# Patient Record
Sex: Female | Born: 1965
Health system: Southern US, Community
[De-identification: ages and names within clinical notes are randomized; demographics above are authoritative.]

## PROBLEM LIST (undated history)

## (undated) DIAGNOSIS — R519 Headache, unspecified: Secondary | ICD-10-CM

## (undated) DIAGNOSIS — F419 Anxiety disorder, unspecified: Secondary | ICD-10-CM

## (undated) DIAGNOSIS — R609 Edema, unspecified: Secondary | ICD-10-CM

## (undated) DIAGNOSIS — N809 Endometriosis, unspecified: Secondary | ICD-10-CM

## (undated) DIAGNOSIS — R102 Pelvic and perineal pain: Secondary | ICD-10-CM

## (undated) DIAGNOSIS — R51 Headache: Secondary | ICD-10-CM

## (undated) DIAGNOSIS — J309 Allergic rhinitis, unspecified: Secondary | ICD-10-CM

## (undated) DIAGNOSIS — K219 Gastro-esophageal reflux disease without esophagitis: Secondary | ICD-10-CM

## (undated) HISTORY — DX: Gastro-esophageal reflux disease without esophagitis: K21.9

## (undated) HISTORY — PX: OOPHORECTOMY: SHX86

## (undated) HISTORY — PX: OTHER SURGICAL HISTORY: SHX169

## (undated) HISTORY — DX: Endometriosis, unspecified: N80.9

## (undated) HISTORY — DX: Edema, unspecified: R60.9

## (undated) HISTORY — DX: Pelvic and perineal pain: R10.2

## (undated) HISTORY — PX: ABDOMINAL HYSTERECTOMY: SHX81

## (undated) HISTORY — DX: Allergic rhinitis, unspecified: J30.9

---

## 2013-05-09 ENCOUNTER — Ambulatory Visit: Payer: Self-pay | Admitting: Family Medicine

## 2013-06-13 LAB — HM PAP SMEAR

## 2013-06-24 LAB — HM MAMMOGRAPHY: HM MAMMO: NORMAL (ref 0–4)

## 2015-06-04 ENCOUNTER — Encounter: Payer: Self-pay | Admitting: Family Medicine

## 2015-06-04 ENCOUNTER — Ambulatory Visit (INDEPENDENT_AMBULATORY_CARE_PROVIDER_SITE_OTHER): Payer: BLUE CROSS/BLUE SHIELD | Admitting: Family Medicine

## 2015-06-04 VITALS — BP 106/70 | HR 71 | Temp 98.5°F | Wt 189.0 lb

## 2015-06-04 DIAGNOSIS — J309 Allergic rhinitis, unspecified: Secondary | ICD-10-CM | POA: Insufficient documentation

## 2015-06-04 DIAGNOSIS — J029 Acute pharyngitis, unspecified: Secondary | ICD-10-CM | POA: Diagnosis not present

## 2015-06-04 DIAGNOSIS — K219 Gastro-esophageal reflux disease without esophagitis: Secondary | ICD-10-CM | POA: Insufficient documentation

## 2015-06-04 NOTE — Progress Notes (Signed)
BP 106/70 mmHg  Pulse 71  Temp(Src) 98.5 F (36.9 C)  Wt 189 lb (85.73 kg)  SpO2 99%   Subjective:    Patient ID: Kathy Reeves, female    DOB: 1966-02-07, 50 y.o.   MRN: [id number]  HPI: Kathy Reeves is a 50 y.o. female  Chief Complaint  Patient presents with  . URI    sore throat, ear ache, and right sided neck pain. Patient has a tonsil stone on the right side   UPPER RESPIRATORY TRACT INFECTION Duration: 2 days Worst symptom: sore throat, has a tonsil stone Fever: yes Cough: yes Shortness of breath: no Wheezing: no Chest pain: yes, with cough Chest tightness: no Chest congestion: no Nasal congestion: yes Runny nose: yes Post nasal drip: yes Sneezing: yes Sore throat: yes Swollen glands: yes Sinus pressure: yes Headache: yes Face pain: no Toothache: no Ear pain: yes bilateral Ear pressure: yes bilateral Eyes red/itching:yes Eye drainage/crusting: no  Vomiting: no Rash: no Fatigue: yes Sick contacts: yes Strep contacts: no  Context: worse Recurrent sinusitis: no Relief with OTC cold/cough medications: no  Treatments attempted: essential oils and anti-histamine   Relevant past medical, surgical, family and social history reviewed and updated as indicated. Interim medical history since our last visit reviewed. Allergies and medications reviewed and updated.  Review of Systems  Constitutional: Positive for fever and fatigue. Negative for chills, diaphoresis, activity change, appetite change and unexpected weight change.  HENT: Positive for postnasal drip and sore throat. Negative for congestion, dental problem, drooling, ear discharge, ear pain, facial swelling, hearing loss, mouth sores, nosebleeds, rhinorrhea, sinus pressure, sneezing, tinnitus, trouble swallowing and voice change.   Respiratory: Negative.   Cardiovascular: Negative.   Psychiatric/Behavioral: Negative.     Per HPI unless specifically indicated above     Objective:    BP 106/70  mmHg  Pulse 71  Temp(Src) 98.5 F (36.9 C)  Wt 189 lb (85.73 kg)  SpO2 99%  Wt Readings from Last 3 Encounters:  06/04/15 189 lb (85.73 kg)  06/13/13 173 lb (78.472 kg)    Physical Exam  Constitutional: She is oriented to person, place, and time. She appears well-developed and well-nourished. No distress.  HENT:  Head: Normocephalic and atraumatic.  Right Ear: Hearing, tympanic membrane, external ear and ear canal normal.  Left Ear: Hearing, tympanic membrane, external ear and ear canal normal.  Nose: Nose normal.  Mouth/Throat: Uvula is midline and mucous membranes are normal. Oropharyngeal exudate present.    Eyes: Conjunctivae, EOM and lids are normal. Pupils are equal, round, and reactive to light. Right eye exhibits no discharge. Left eye exhibits no discharge. No scleral icterus.  Neck: Normal range of motion. Neck supple. No JVD present. No tracheal deviation present. No thyromegaly present.  Cardiovascular: Normal rate, regular rhythm, normal heart sounds and intact distal pulses.  Exam reveals no gallop and no friction rub.   No murmur heard. Pulmonary/Chest: Effort normal and breath sounds normal. No stridor. No respiratory distress. She has no wheezes. She has no rales. She exhibits no tenderness.  Musculoskeletal: Normal range of motion.  Lymphadenopathy:    She has cervical adenopathy.  Neurological: She is alert and oriented to person, place, and time.  Skin: Skin is warm, dry and intact. No rash noted. She is not diaphoretic. No erythema.  Psychiatric: She has a normal mood and affect. Her speech is normal and behavior is normal. Judgment and thought content normal. Cognition and memory are normal.  Nursing note and  vitals reviewed.   Results for orders placed or performed in visit on 06/04/15  HM MAMMOGRAPHY  Result Value Ref Range   HM Mammogram Self Reported Normal 0-4 Bi-Rad, Self Reported Normal  HM PAP SMEAR  Result Value Ref Range   HM Pap smear per PP         Assessment & Plan:   Problem List Items Addressed This Visit    None    Visit Diagnoses    Sore throat    -  Primary    Strep negative. Likely URI. Rest. Gargles with warm salt water. Call if not getting better or getting worse. Call with any concerns.     Relevant Orders    Rapid strep screen (not at [facility])        Follow up plan: Return if symptoms worsen or fail to improve.

## 2015-06-06 LAB — CULTURE, GROUP A STREP: STREP A CULTURE: NEGATIVE

## 2015-06-06 LAB — RAPID STREP SCREEN (MED CTR MEBANE ONLY): STREP GP A AG, IA W/REFLEX: NEGATIVE

## 2015-06-19 ENCOUNTER — Ambulatory Visit (INDEPENDENT_AMBULATORY_CARE_PROVIDER_SITE_OTHER): Payer: BLUE CROSS/BLUE SHIELD | Admitting: Unknown Physician Specialty

## 2015-06-19 ENCOUNTER — Encounter: Payer: Self-pay | Admitting: Unknown Physician Specialty

## 2015-06-19 ENCOUNTER — Telehealth: Payer: Self-pay

## 2015-06-19 VITALS — BP 121/77 | HR 67 | Temp 98.4°F | Ht 63.7 in | Wt 192.2 lb

## 2015-06-19 DIAGNOSIS — N898 Other specified noninflammatory disorders of vagina: Secondary | ICD-10-CM | POA: Diagnosis not present

## 2015-06-19 LAB — WET PREP FOR TRICH, YEAST, CLUE
CLUE CELL EXAM: NEGATIVE
Trichomonas Exam: NEGATIVE
YEAST EXAM: NEGATIVE

## 2015-06-19 NOTE — Telephone Encounter (Signed)
Patient notified. She thinks she may be able to get transportation this afternoon. She scheduled an appt to see Elnita MaxwellCheryl this afternoon. She will call back if she is unable to get a ride.

## 2015-06-19 NOTE — Progress Notes (Signed)
   BP 121/77 mmHg  Pulse 67  Temp(Src) 98.4 F (36.9 C)  Ht 5' 3.7" (1.618 m)  Wt 192 lb 3.2 oz (87.181 kg)  BMI 33.30 kg/m2  SpO2 99%  LMP 05/29/2015 (Exact Date)   Subjective:    Patient ID: Kathy Reeves, female    DOB: 1965/12/31, 50 y.o.   MRN: [id number]  HPI: Kathy Reeves is a 50 y.o. female  Chief Complaint  Patient presents with  . Cyst    pt states she thinks she has a cyst in vaginal area. States she first noticed/felt it this morning.    States she noticed it this morning.  Also noticing more discharge.  Endometrial ablation 1 1/2 years ago.  Having some bleeding.    Relevant past medical, surgical, family and social history reviewed and updated as indicated. Interim medical history since our last visit reviewed. Allergies and medications reviewed and updated.  Review of Systems  Per HPI unless specifically indicated above     Objective:    BP 121/77 mmHg  Pulse 67  Temp(Src) 98.4 F (36.9 C)  Ht 5' 3.7" (1.618 m)  Wt 192 lb 3.2 oz (87.181 kg)  BMI 33.30 kg/m2  SpO2 99%  LMP 05/29/2015 (Exact Date)  Wt Readings from Last 3 Encounters:  06/19/15 192 lb 3.2 oz (87.181 kg)  06/04/15 189 lb (85.73 kg)  06/13/13 173 lb (78.472 kg)    Physical Exam  Constitutional: She is oriented to person, place, and time. She appears well-developed and well-nourished. No distress.  HENT:  Head: Normocephalic and atraumatic.  Eyes: Conjunctivae and lids are normal. Right eye exhibits no discharge. Left eye exhibits no discharge. No scleral icterus.  Cardiovascular: Normal rate.   Pulmonary/Chest: Effort normal.  Abdominal: Normal appearance. There is no splenomegaly or hepatomegaly.  Genitourinary: There is no rash, tenderness, lesion or injury on the right labia. There is lesion on the left labia. There is no rash, tenderness or injury on the left labia. Vaginal discharge found.  Very small firm nodule left labia  Musculoskeletal: Normal range of motion.   Neurological: She is alert and oriented to person, place, and time.  Skin: Skin is intact. No rash noted. No pallor.  Psychiatric: She has a normal mood and affect. Her behavior is normal. Judgment and thought content normal.   Wet prep is negative    Assessment & Plan:   Problem List Items Addressed This Visit    None    Visit Diagnoses    Vaginal discharge    -  Primary    Relevant Orders    WET PREP FOR TRICH, YEAST, CLUE    Vaginal lesion        Tis seems to be improved.  It is a 1/2 pea size now.  Will watch for now.  Bartholian glands are normal        Follow up plan: Return if symptoms worsen or fail to improve.

## 2015-06-19 NOTE — Telephone Encounter (Signed)
She had some irritation and soreness in her vaginal area. This morning it was more tender and she felt a lump at her vaginal area. She thinks it might have been a Bartholin gland cyst. She applied a warm compress to the area and she thinks it has busted. She said there is some burning, but feels better when sitting. She wants advice on it she should be seen? Or if there is anything she needs to do or creams to use? She doesn't have a car today so she can't come in.

## 2015-06-19 NOTE — Telephone Encounter (Signed)
She should be seen. Do sitz baths until she can come in.

## 2015-06-23 LAB — LIPID PANEL
CHOLESTEROL: 187 mg/dL (ref 0–200)
HDL: 66 mg/dL (ref 35–70)
LDL Cholesterol: 102 mg/dL
Triglycerides: 89 mg/dL (ref 40–160)

## 2015-07-02 ENCOUNTER — Encounter: Payer: Self-pay | Admitting: Family Medicine

## 2015-07-02 ENCOUNTER — Ambulatory Visit (INDEPENDENT_AMBULATORY_CARE_PROVIDER_SITE_OTHER): Payer: BLUE CROSS/BLUE SHIELD | Admitting: Family Medicine

## 2015-07-02 VITALS — BP 112/74 | HR 76 | Temp 98.6°F | Ht 63.7 in | Wt 190.0 lb

## 2015-07-02 DIAGNOSIS — Z1239 Encounter for other screening for malignant neoplasm of breast: Secondary | ICD-10-CM

## 2015-07-02 DIAGNOSIS — Z0000 Encounter for general adult medical examination without abnormal findings: Secondary | ICD-10-CM

## 2015-07-02 DIAGNOSIS — Z1211 Encounter for screening for malignant neoplasm of colon: Secondary | ICD-10-CM

## 2015-07-02 LAB — UA/M W/RFLX CULTURE, ROUTINE
Bilirubin, UA: NEGATIVE
GLUCOSE, UA: NEGATIVE
KETONES UA: NEGATIVE
LEUKOCYTES UA: NEGATIVE
Nitrite, UA: NEGATIVE
PROTEIN UA: NEGATIVE
RBC, UA: NEGATIVE
Specific Gravity, UA: 1.015 (ref 1.005–1.030)
UUROB: 0.2 mg/dL (ref 0.2–1.0)
pH, UA: 7.5 (ref 5.0–7.5)

## 2015-07-02 NOTE — Patient Instructions (Signed)
Health Maintenance, Female Adopting a healthy lifestyle and getting preventive care can go a long way to promote health and wellness. Talk with your health care provider about what schedule of regular examinations is right for you. This is a good chance for you to check in with your provider about disease prevention and staying healthy. In between checkups, there are plenty of things you can do on your own. Experts have done a lot of research about which lifestyle changes and preventive measures are most likely to keep you healthy. Ask your health care provider for more information. WEIGHT AND DIET  Eat a healthy diet  Be sure to include plenty of vegetables, fruits, low-fat dairy products, and lean protein.  Do not eat a lot of foods high in solid fats, added sugars, or salt.  Get regular exercise. This is one of the most important things you can do for your health.  Most adults should exercise for at least 150 minutes each week. The exercise should increase your heart rate and make you sweat (moderate-intensity exercise).  Most adults should also do strengthening exercises at least twice a week. This is in addition to the moderate-intensity exercise.  Maintain a healthy weight  Body mass index (BMI) is a measurement that can be used to identify possible weight problems. It estimates body fat based on height and weight. Your health care provider can help determine your BMI and help you achieve or maintain a healthy weight.  For females 33 years of age and older:   A BMI below 18.5 is considered underweight.  A BMI of 18.5 to 24.9 is normal.  A BMI of 25 to 29.9 is considered overweight.  A BMI of 30 and above is considered obese.  Watch levels of cholesterol and blood lipids  You should start having your blood tested for lipids and cholesterol at 51 years of age, then have this test every 5 years.  You may need to have your cholesterol levels checked more often if:  Your lipid  or cholesterol levels are high.  You are older than 51 years of age.  You are at high risk for heart disease.  CANCER SCREENING   Lung Cancer  Lung cancer screening is recommended for adults 51-80 years old who are at high risk for lung cancer because of a history of smoking.  A yearly low-dose CT scan of the lungs is recommended for people who:  Currently smoke.  Have quit within the past 15 years.  Have at least a 30-pack-year history of smoking. A pack year is smoking an average of one pack of cigarettes a day for 1 year.  Yearly screening should continue until it has been 15 years since you quit.  Yearly screening should stop if you develop a health problem that would prevent you from having lung cancer treatment.  Breast Cancer  Practice breast self-awareness. This means understanding how your breasts normally appear and feel.  It also means doing regular breast self-exams. Let your health care provider know about any changes, no matter how small.  If you are in your 51 or 30s, you should have a clinical breast exam (CBE) by a health care provider every 1-3 years as part of a regular health exam.  If you are 51 or older, have a CBE every year. Also consider having a breast X-ray (mammogram) every year.  If you have a family history of breast cancer, talk to your health care provider about genetic screening.  If you  are at high risk for breast cancer, talk to your health care provider about having an MRI and a mammogram every year.  Breast cancer gene (BRCA) assessment is recommended for women who have family members with BRCA-related cancers. BRCA-related cancers include:  Breast.  Ovarian.  Tubal.  Peritoneal cancers.  Results of the assessment will determine the need for genetic counseling and BRCA1 and BRCA2 testing. Cervical Cancer Your health care provider may recommend that you be screened regularly for cancer of the pelvic organs (ovaries, uterus, and  vagina). This screening involves a pelvic examination, including checking for microscopic changes to the surface of your cervix (Pap test). You may be encouraged to have this screening done every 3 years, beginning at age 51.  For women ages 51-65, health care providers may recommend pelvic exams and Pap testing every 3 years, or they may recommend the Pap and pelvic exam, combined with testing for human papilloma virus (HPV), every 5 years. Some types of HPV increase your risk of cervical cancer. Testing for HPV may also be done on women of any age with unclear Pap test results.  Other health care providers may not recommend any screening for nonpregnant women who are considered low risk for pelvic cancer and who do not have symptoms. Ask your health care provider if a screening pelvic exam is right for you.  If you have had past treatment for cervical cancer or a condition that could lead to cancer, you need Pap tests and screening for cancer for at least 20 years after your treatment. If Pap tests have been discontinued, your risk factors (such as having a new sexual partner) need to be reassessed to determine if screening should resume. Some women have medical problems that increase the chance of getting cervical cancer. In these cases, your health care provider may recommend more frequent screening and Pap tests. Colorectal Cancer  This type of cancer can be detected and often prevented.  Routine colorectal cancer screening usually begins at 50 years of age and continues through 51 years of age.  Your health care provider may recommend screening at an earlier age if you have risk factors for colon cancer.  Your health care provider may also recommend using home test kits to check for hidden blood in the stool.  A small camera at the end of a tube can be used to examine your colon directly (sigmoidoscopy or colonoscopy). This is done to check for the earliest forms of colorectal  cancer.  Routine screening usually begins at age 50.  Direct examination of the colon should be repeated every 5-10 years through 51 years of age. However, you may need to be screened more often if early forms of precancerous polyps or small growths are found. Skin Cancer  Check your skin from head to toe regularly.  Tell your health care provider about any new moles or changes in moles, especially if there is a change in a mole's shape or color.  Also tell your health care provider if you have a mole that is larger than the size of a pencil eraser.  Always use sunscreen. Apply sunscreen liberally and repeatedly throughout the day.  Protect yourself by wearing long sleeves, pants, a wide-brimmed hat, and sunglasses whenever you are outside. HEART DISEASE, DIABETES, AND HIGH BLOOD PRESSURE   High blood pressure causes heart disease and increases the risk of stroke. High blood pressure is more likely to develop in:  People who have blood pressure in the high end   of the normal range (130-139/85-89 mm Hg).  People who are overweight or obese.  People who are African American.  If you are 38-23 years of age, have your blood pressure checked every 3-5 years. If you are 61 years of age or older, have your blood pressure checked every year. You should have your blood pressure measured twice--once when you are at a hospital or clinic, and once when you are not at a hospital or clinic. Record the average of the two measurements. To check your blood pressure when you are not at a hospital or clinic, you can use:  An automated blood pressure machine at a pharmacy.  A home blood pressure monitor.  If you are between 45 years and 39 years old, ask your health care provider if you should take aspirin to prevent strokes.  Have regular diabetes screenings. This involves taking a blood sample to check your fasting blood sugar level.  If you are at a normal weight and have a low risk for diabetes,  have this test once every three years after 51 years of age.  If you are overweight and have a high risk for diabetes, consider being tested at a younger age or more often. PREVENTING INFECTION  Hepatitis B  If you have a higher risk for hepatitis B, you should be screened for this virus. You are considered at high risk for hepatitis B if:  You were born in a country where hepatitis B is common. Ask your health care provider which countries are considered high risk.  Your parents were born in a high-risk country, and you have not been immunized against hepatitis B (hepatitis B vaccine).  You have HIV or AIDS.  You use needles to inject street drugs.  You live with someone who has hepatitis B.  You have had sex with someone who has hepatitis B.  You get hemodialysis treatment.  You take certain medicines for conditions, including cancer, organ transplantation, and autoimmune conditions. Hepatitis C  Blood testing is recommended for:  Everyone born from [birth year] through 1966.  Anyone with known risk factors for hepatitis C. Sexually transmitted infections (STIs)  You should be screened for sexually transmitted infections (STIs) including gonorrhea and chlamydia if:  You are sexually active and are younger than 51 years of age.  You are older than 51 years of age and your health care provider tells you that you are at risk for this type of infection.  Your sexual activity has changed since you were last screened and you are at an increased risk for chlamydia or gonorrhea. Ask your health care provider if you are at risk.  If you do not have HIV, but are at risk, it may be recommended that you take a prescription medicine daily to prevent HIV infection. This is called pre-exposure prophylaxis (PrEP). You are considered at risk if:  You are sexually active and do not regularly use condoms or know the HIV status of your partner(s).  You take drugs by injection.  You are sexually  active with a partner who has HIV. Talk with your health care provider about whether you are at high risk of being infected with HIV. If you choose to begin PrEP, you should first be tested for HIV. You should then be tested every 3 months for as long as you are taking PrEP.  PREGNANCY   If you are premenopausal and you may become pregnant, ask your health care provider about preconception counseling.  If you may  become pregnant, take 400 to 800 micrograms (mcg) of folic acid every day.  If you want to prevent pregnancy, talk to your health care provider about birth control (contraception). OSTEOPOROSIS AND MENOPAUSE   Osteoporosis is a disease in which the bones lose minerals and strength with aging. This can result in serious bone fractures. Your risk for osteoporosis can be identified using a bone density scan.  If you are 61 years of age or older, or if you are at risk for osteoporosis and fractures, ask your health care provider if you should be screened.  Ask your health care provider whether you should take a calcium or vitamin D supplement to lower your risk for osteoporosis.  Menopause may have certain physical symptoms and risks.  Hormone replacement therapy may reduce some of these symptoms and risks. Talk to your health care provider about whether hormone replacement therapy is right for you.  HOME CARE INSTRUCTIONS   Schedule regular health, dental, and eye exams.  Stay current with your immunizations.   Do not use any tobacco products including cigarettes, chewing tobacco, or electronic cigarettes.  If you are pregnant, do not drink alcohol.  If you are breastfeeding, limit how much and how often you drink alcohol.  Limit alcohol intake to no more than 1 drink per day for nonpregnant women. One drink equals 12 ounces of beer, 5 ounces of wine, or 1 ounces of hard liquor.  Do not use street drugs.  Do not share needles.  Ask your health care provider for help if  you need support or information about quitting drugs.  Tell your health care provider if you often feel depressed.  Tell your health care provider if you have ever been abused or do not feel safe at home.   This information is not intended to replace advice given to you by your health care provider. Make sure you discuss any questions you have with your health care provider.   Document Released: 09/02/2010 Document Revised: 03/10/2014 Document Reviewed: 01/19/2013 Elsevier Interactive Patient Education [employer].

## 2015-07-02 NOTE — Progress Notes (Signed)
BP 112/74 mmHg  Pulse 76  Temp(Src) 98.6 F (37 C)  Ht 5' 3.7" (1.618 m)  Wt 190 lb (86.183 kg)  BMI 32.92 kg/m2  SpO2 99%  LMP 06/19/2015 (Exact Date)   Subjective:    Patient ID: Kathy Reeves, female    DOB: 1966/02/18, 50 y.o.   MRN: [id number]  HPI: Kathy Reeves is a 50 y.o. female presenting on 07/02/2015 for comprehensive medical examination. Current medical complaints include: none  She currently lives with: husband and kids Menopausal Symptoms: no, irregular periods though  Depression Screen done today and results listed below:  Depression screen PHQ 2/9 07/02/2015  Decreased Interest 0  Down, Depressed, Hopeless 1  PHQ - 2 Score 1    Past Medical History:  Past Medical History  Diagnosis Date  . Allergic rhinitis   . GERD (gastroesophageal reflux disease)     Surgical History:  Past Surgical History  Procedure Laterality Date  . Cesarean section    . Uterus ablation      Medications:  Current Outpatient Prescriptions on File Prior to Visit  Medication Sig  . cetirizine (ZYRTEC) 10 MG chewable tablet Chew 10 mg by mouth daily as needed.   . Cyanocobalamin (VITAMIN B-12 CR) 1000 MCG TBCR Take by mouth as needed.   . Lactobacillus (ACIDOPHILUS PO) Take by mouth as needed.   . Magnesium 250 MG TABS Take by mouth as needed.   . Omega-3 Fatty Acids (FISH OIL) 1200 MG CAPS Take 1,200 mg by mouth as needed.    No current facility-administered medications on file prior to visit.    Allergies:  Allergies  Allergen Reactions  . Latex Rash    Social History:  Social History   Social History  . Marital Status: Married    Spouse Name: N/A  . Number of Children: N/A  . Years of Education: N/A   Occupational History  . Not on file.   Social History Main Topics  . Smoking status: Never Smoker   . Smokeless tobacco: Never Used  . Alcohol Use: No  . Drug Use: No  . Sexual Activity: Yes    Birth Control/ Protection: Surgical   Other Topics Concern    . Not on file   Social History Narrative   History  Smoking status  . Never Smoker   Smokeless tobacco  . Never Used   History  Alcohol Use No    Family History:  Family History  Problem Relation Age of Onset  . CAD Mother   . Heart disease Mother   . Fibromyalgia Mother   . Arthritis Mother   . Cystic kidney disease Mother   . Diabetes Father     Past medical history, surgical history, medications, allergies, family history and social history reviewed with patient today and changes made to appropriate areas of the chart.   Review of Systems  HENT: Negative.   Eyes: Negative.   Respiratory: Negative.   Cardiovascular: Positive for palpitations. Negative for chest pain, orthopnea, claudication, leg swelling and PND.  Gastrointestinal: Positive for abdominal pain (LLQ pain, chronic, no changes). Negative for heartburn, nausea, vomiting, diarrhea, constipation, blood in stool and melena.  Genitourinary: Negative.   Musculoskeletal: Positive for joint pain (due to previous hip injury). Negative for myalgias, back pain, falls and neck pain.  Skin: Negative.   Neurological: Negative.   Endo/Heme/Allergies: Positive for environmental allergies. Negative for polydipsia. Does not bruise/bleed easily.  Psychiatric/Behavioral: Negative.     All other ROS  negative except what is listed above and in the HPI.      Objective:    BP 112/74 mmHg  Pulse 76  Temp(Src) 98.6 F (37 C)  Ht 5' 3.7" (1.618 m)  Wt 190 lb (86.183 kg)  BMI 32.92 kg/m2  SpO2 99%  LMP 06/19/2015 (Exact Date)  Wt Readings from Last 3 Encounters:  07/02/15 190 lb (86.183 kg)  06/19/15 192 lb 3.2 oz (87.181 kg)  06/04/15 189 lb (85.73 kg)    Physical Exam  Constitutional: She is oriented to person, place, and time. She appears well-developed and well-nourished. No distress.  HENT:  Head: Normocephalic and atraumatic.  Right Ear: Hearing and external ear normal.  Left Ear: Hearing and external ear  normal.  Nose: Nose normal.  Mouth/Throat: Oropharynx is clear and moist. No oropharyngeal exudate.  Eyes: Conjunctivae, EOM and lids are normal. Pupils are equal, round, and reactive to light. Right eye exhibits no discharge. Left eye exhibits no discharge. No scleral icterus.  Neck: Normal range of motion. Neck supple. No JVD present. No tracheal deviation present. No thyromegaly present.  Cardiovascular: Normal rate, regular rhythm, normal heart sounds and intact distal pulses.  Exam reveals no gallop and no friction rub.   No murmur heard. Pulmonary/Chest: Effort normal and breath sounds normal. No stridor. No respiratory distress. She has no wheezes. She has no rales. She exhibits no tenderness. Right breast exhibits no inverted nipple, no mass, no nipple discharge, no skin change and no tenderness. Left breast exhibits no inverted nipple, no mass, no nipple discharge, no skin change and no tenderness. Breasts are symmetrical.    Abdominal: Soft. Bowel sounds are normal. She exhibits no distension and no mass. There is no tenderness. There is no rebound and no guarding.  Genitourinary:  Deferred with shared decision making   Musculoskeletal: Normal range of motion. She exhibits no edema or tenderness.  Lymphadenopathy:    She has no cervical adenopathy.  Neurological: She is alert and oriented to person, place, and time. She has normal reflexes. She displays normal reflexes. No cranial nerve deficit. She exhibits normal muscle tone. Coordination normal.  Skin: Skin is warm, dry and intact. No rash noted. She is not diaphoretic. No erythema. No pallor.  >100 moles, none looking concerning  Psychiatric: She has a normal mood and affect. Her speech is normal and behavior is normal. Judgment and thought content normal. Cognition and memory are normal.  Nursing note and vitals reviewed.   Results for orders placed or performed in visit on 07/02/15  Lipid panel  Result Value Ref Range    Triglycerides 89 40 - 160 mg/dL   Cholesterol 161 0 - 096 mg/dL   HDL 66 35 - 70 mg/dL   LDL Cholesterol 045 mg/dL      Assessment & Plan:   Problem List Items Addressed This Visit    None    Visit Diagnoses    Routine general medical examination at a health care facility    -  Primary    Screening labs ordered today. Diet and exercise. Up to date on vaccines. Pap up to date. Mammogram ordered. Will look into colon cancer screening.    Relevant Orders    CBC with Differential/Platelet    Comprehensive metabolic panel    TSH    UA/M w/rflx Culture, Routine    MM SCREENING BREAST TOMO BILATERAL    Screening for colon cancer        Will check on the cost  of cologuard. FOBT given to patient today     Relevant Orders    POC Hemoccult Bld/Stl (3-Cd Home Screen)    Screening for breast cancer        Order for mammogram put in today. Await results.     Relevant Orders    MM SCREENING BREAST TOMO BILATERAL        Follow up plan: Return in about 1 year (around 07/01/2016) for Physcial with Pap.   LABORATORY TESTING:  - Pap smear: up to date  IMMUNIZATIONS:   - Tdap: Tetanus vaccination status reviewed: last tetanus booster within 10 years. - Influenza: Postponed to flu season  SCREENING: -Mammogram: Ordered today  - Colonoscopy: Will check on cologuard, FOBT given today   PATIENT COUNSELING:   Advised to take 1 mg of folate supplement per day if capable of pregnancy.   Sexuality: Discussed sexually transmitted diseases, partner selection, use of condoms, avoidance of unintended pregnancy  and contraceptive alternatives.   Advised to avoid cigarette smoking.  I discussed with the patient that most people either abstain from alcohol or drink within safe limits (<=14/week and <=4 drinks/occasion for males, <=7/weeks and <= 3 drinks/occasion for females) and that the risk for alcohol disorders and other health effects rises proportionally with the number of drinks per week and  how often a drinker exceeds daily limits.  Discussed cessation/primary prevention of drug use and availability of treatment for abuse.   Diet: Encouraged to adjust caloric intake to maintain  or achieve ideal body weight, to reduce intake of dietary saturated fat and total fat, to limit sodium intake by avoiding high sodium foods and not adding table salt, and to maintain adequate dietary potassium and calcium preferably from fresh fruits, vegetables, and low-fat dairy products.    stressed the importance of regular exercise  Injury prevention: Discussed safety belts, safety helmets, smoke detector, smoking near bedding or upholstery.   Dental health: Discussed importance of regular tooth brushing, flossing, and dental visits.    NEXT PREVENTATIVE PHYSICAL DUE IN 1 YEAR. Return in about 1 year (around 07/01/2016) for Physcial with Pap.

## 2015-07-03 ENCOUNTER — Encounter: Payer: Self-pay | Admitting: Family Medicine

## 2015-07-03 LAB — CBC WITH DIFFERENTIAL/PLATELET
BASOS ABS: 0 10*3/uL (ref 0.0–0.2)
Basos: 1 %
EOS (ABSOLUTE): 0.2 10*3/uL (ref 0.0–0.4)
Eos: 3 %
HEMOGLOBIN: 13.2 g/dL (ref 11.1–15.9)
Hematocrit: 38 % (ref 34.0–46.6)
IMMATURE GRANS (ABS): 0 10*3/uL (ref 0.0–0.1)
IMMATURE GRANULOCYTES: 0 %
LYMPHS: 25 %
Lymphocytes Absolute: 1.5 10*3/uL (ref 0.7–3.1)
MCH: 30.3 pg (ref 26.6–33.0)
MCHC: 34.7 g/dL (ref 31.5–35.7)
MCV: 87 fL (ref 79–97)
Monocytes Absolute: 0.4 10*3/uL (ref 0.1–0.9)
Monocytes: 6 %
NEUTROS PCT: 65 %
Neutrophils Absolute: 3.9 10*3/uL (ref 1.4–7.0)
PLATELETS: 299 10*3/uL (ref 150–379)
RBC: 4.35 x10E6/uL (ref 3.77–5.28)
RDW: 13.6 % (ref 12.3–15.4)
WBC: 5.9 10*3/uL (ref 3.4–10.8)

## 2015-07-03 LAB — COMPREHENSIVE METABOLIC PANEL
A/G RATIO: 1.7 (ref 1.2–2.2)
ALBUMIN: 4.3 g/dL (ref 3.5–5.5)
ALT: 16 IU/L (ref 0–32)
AST: 20 IU/L (ref 0–40)
Alkaline Phosphatase: 51 IU/L (ref 39–117)
BUN / CREAT RATIO: 12 (ref 9–23)
BUN: 9 mg/dL (ref 6–24)
Bilirubin Total: 0.4 mg/dL (ref 0.0–1.2)
CALCIUM: 9.3 mg/dL (ref 8.7–10.2)
CO2: 24 mmol/L (ref 18–29)
Chloride: 102 mmol/L (ref 96–106)
Creatinine, Ser: 0.73 mg/dL (ref 0.57–1.00)
GFR, EST AFRICAN AMERICAN: 111 mL/min/{1.73_m2} (ref >59)
GFR, EST NON AFRICAN AMERICAN: 96 mL/min/{1.73_m2} (ref >59)
Globulin, Total: 2.5 g/dL (ref 1.5–4.5)
Glucose: 91 mg/dL (ref 65–99)
POTASSIUM: 4.5 mmol/L (ref 3.5–5.2)
Sodium: 143 mmol/L (ref 134–144)
TOTAL PROTEIN: 6.8 g/dL (ref 6.0–8.5)

## 2015-07-03 LAB — TSH: TSH: 1.19 u[IU]/mL (ref 0.450–4.500)

## 2015-08-17 ENCOUNTER — Ambulatory Visit
Admission: RE | Admit: 2015-08-17 | Discharge: 2015-08-17 | Disposition: A | Discharge status: Home / Self Care | Payer: BLUE CROSS/BLUE SHIELD | Source: Ambulatory Visit | Attending: Family Medicine | Admitting: Family Medicine

## 2015-08-17 DIAGNOSIS — Z1231 Encounter for screening mammogram for malignant neoplasm of breast: Secondary | ICD-10-CM | POA: Insufficient documentation

## 2015-08-17 DIAGNOSIS — Z1239 Encounter for other screening for malignant neoplasm of breast: Secondary | ICD-10-CM

## 2015-08-17 DIAGNOSIS — Z0000 Encounter for general adult medical examination without abnormal findings: Secondary | ICD-10-CM

## 2015-08-21 ENCOUNTER — Encounter: Payer: Self-pay | Admitting: Family Medicine

## 2015-08-22 ENCOUNTER — Telehealth: Payer: Self-pay

## 2015-08-22 NOTE — Telephone Encounter (Signed)
Pt called stated she has received a bill from [facility] for labs completed during her physical. Pt stated she contacted our billing department as well as [facility] billing and they stated the coding was incorrect so insurance is not paying the claim. Please assist and contact pt for follow up. Thanks.

## 2015-09-10 NOTE — Telephone Encounter (Signed)
Spoke with patient regarding [facility] invoice.  Pt states that she will call me back when she is able to talk.

## 2016-01-21 ENCOUNTER — Ambulatory Visit (INDEPENDENT_AMBULATORY_CARE_PROVIDER_SITE_OTHER): Payer: BLUE CROSS/BLUE SHIELD | Admitting: Unknown Physician Specialty

## 2016-01-21 ENCOUNTER — Encounter: Payer: Self-pay | Admitting: Unknown Physician Specialty

## 2016-01-21 VITALS — BP 123/74 | HR 81 | Temp 98.4°F | Ht 64.8 in | Wt 195.0 lb

## 2016-01-21 DIAGNOSIS — N760 Acute vaginitis: Secondary | ICD-10-CM | POA: Diagnosis not present

## 2016-01-21 DIAGNOSIS — N72 Inflammatory disease of cervix uteri: Secondary | ICD-10-CM

## 2016-01-21 DIAGNOSIS — R1030 Lower abdominal pain, unspecified: Secondary | ICD-10-CM

## 2016-01-21 DIAGNOSIS — R87615 Unsatisfactory cytologic smear of cervix: Secondary | ICD-10-CM | POA: Diagnosis not present

## 2016-01-21 DIAGNOSIS — N814 Uterovaginal prolapse, unspecified: Secondary | ICD-10-CM | POA: Diagnosis not present

## 2016-01-21 DIAGNOSIS — N3001 Acute cystitis with hematuria: Secondary | ICD-10-CM | POA: Diagnosis not present

## 2016-01-21 MED ORDER — CIPROFLOXACIN HCL 500 MG PO TABS: 500.0000 mg | ORAL_TABLET | Freq: Two times a day (BID) | ORAL | 0 refills | Status: DC

## 2016-01-21 MED ORDER — METRONIDAZOLE 500 MG PO TABS: 500.0000 mg | ORAL_TABLET | Freq: Two times a day (BID) | ORAL | 0 refills | Status: DC

## 2016-01-21 MED ORDER — FLUCONAZOLE 150 MG PO TABS: 150.0000 mg | ORAL_TABLET | Freq: Once | ORAL | 0 refills | Status: AC

## 2016-01-21 NOTE — Progress Notes (Signed)
BP 123/74 (BP Location: Left Arm, Patient Position: Sitting, Cuff Size: Normal)   Pulse 81   Temp 98.4 F (36.9 C)   Ht 5' 4.8" (1.646 m)   Wt 195 lb (88.5 kg)   LMP 09/08/2015 (Exact Date)   SpO2 96%   BMI 32.65 kg/m    Subjective:    Patient ID: Kathy Reeves, female    DOB: [birth year], 50 y.o.   MRN: [id number]  HPI: Kathy Reeves is a 50 y.o. female  Chief Complaint  Patient presents with  . Abdominal Pain    pt states she has been having lower left abdominal pain for about a week now, states the pain usually comes and goes and feels like it may come form ovulation  . Vaginal Irritation    pt states she has been having vaginal irritation and discharge for about 5 to 6 days    States she is having pain that comes and goes for 2-3 months.  States she is here today as it has gotten worse.  States pain is sharp and can take breath away and sometimes it feels tight.  Pain at times with intercourse  States she has a lot of irritation and states she feels like she is "sitting on a ball"  A lot of thin discharge with a lot of irritation.    No menses since July.   Relevant past medical, surgical, family and social history reviewed and updated as indicated. Interim medical history since our last visit reviewed. Allergies and medications reviewed and updated.  Review of Systems  Per HPI unless specifically indicated above     Objective:    BP 123/74 (BP Location: Left Arm, Patient Position: Sitting, Cuff Size: Normal)   Pulse 81   Temp 98.4 F (36.9 C)   Ht 5' 4.8" (1.646 m)   Wt 195 lb (88.5 kg)   LMP 09/08/2015 (Exact Date)   SpO2 96%   BMI 32.65 kg/m   Wt Readings from Last 3 Encounters:  01/21/16 195 lb (88.5 kg)  07/02/15 190 lb (86.2 kg)  06/19/15 192 lb 3.2 oz (87.2 kg)    Physical Exam  Constitutional: She is oriented to person, place, and time. She appears well-developed and well-nourished. No distress.  HENT:  Head: Normocephalic and atraumatic.    Eyes: Conjunctivae and lids are normal. Right eye exhibits no discharge. Left eye exhibits no discharge. No scleral icterus.  Cardiovascular: Normal rate.   Pulmonary/Chest: Effort normal.  Abdominal: Normal appearance. There is no splenomegaly or hepatomegaly.  Genitourinary: Vagina normal. There is no rash on the right labia. There is no rash on the left labia. Uterus is enlarged. Cervix exhibits motion tenderness and discharge. Right adnexum displays no mass. Left adnexum displays no mass.  Musculoskeletal: Normal range of motion.  Neurological: She is alert and oriented to person, place, and time.  Skin: Skin is intact. No rash noted. No pallor.  Psychiatric: She has a normal mood and affect. Her behavior is normal. Judgment and thought content normal.    Results for orders placed or performed in visit on 07/02/15  CBC with Differential/Platelet  Result Value Ref Range   WBC 5.9 3.4 - 10.8 x10E3/uL   RBC 4.35 3.77 - 5.28 x10E6/uL   Hemoglobin 13.2 11.1 - 15.9 g/dL   Hematocrit 33.238.0 95.134.0 - 46.6 %   MCV 87 79 - 97 fL   MCH 30.3 26.6 - 33.0 pg   MCHC 34.7 31.5 - 35.7 g/dL  RDW 13.6 12.3 - 15.4 %   Platelets 299 150 - 379 x10E3/uL   Neutrophils 65 %   Lymphs 25 %   Monocytes 6 %   Eos 3 %   Basos 1 %   Neutrophils Absolute 3.9 1.4 - 7.0 x10E3/uL   Lymphocytes Absolute 1.5 0.7 - 3.1 x10E3/uL   Monocytes Absolute 0.4 0.1 - 0.9 x10E3/uL   EOS (ABSOLUTE) 0.2 0.0 - 0.4 x10E3/uL   Basophils Absolute 0.0 0.0 - 0.2 x10E3/uL   Immature Granulocytes 0 %   Immature Grans (Abs) 0.0 0.0 - 0.1 x10E3/uL  Comprehensive metabolic panel  Result Value Ref Range   Glucose 91 65 - 99 mg/dL   BUN 9 6 - 24 mg/dL   Creatinine, Ser 1.610.73 0.57 - 1.00 mg/dL   GFR calc non Af Amer 96 >59 mL/min/1.73   GFR calc Af Amer 111 >59 mL/min/1.73   BUN/Creatinine Ratio 12 9 - 23   Sodium 143 134 - 144 mmol/L   Potassium 4.5 3.5 - 5.2 mmol/L   Chloride 102 96 - 106 mmol/L   CO2 24 18 - 29 mmol/L   Calcium  9.3 8.7 - 10.2 mg/dL   Total Protein 6.8 6.0 - 8.5 g/dL   Albumin 4.3 3.5 - 5.5 g/dL   Globulin, Total 2.5 1.5 - 4.5 g/dL   Albumin/Globulin Ratio 1.7 1.2 - 2.2   Bilirubin Total 0.4 0.0 - 1.2 mg/dL   Alkaline Phosphatase 51 39 - 117 IU/L   AST 20 0 - 40 IU/L   ALT 16 0 - 32 IU/L  TSH  Result Value Ref Range   TSH 1.190 0.450 - 4.500 uIU/mL  UA/M w/rflx Culture, Routine  Result Value Ref Range   Specific Gravity, UA 1.015 1.005 - 1.030   pH, UA 7.5 5.0 - 7.5   Color, UA Yellow Yellow   Appearance Ur Clear Clear   Leukocytes, UA Negative Negative   Protein, UA Negative Negative/Trace   Glucose, UA Negative Negative   Ketones, UA Negative Negative   RBC, UA Negative Negative   Bilirubin, UA Negative Negative   Urobilinogen, Ur 0.2 0.2 - 1.0 mg/dL   Nitrite, UA Negative Negative  Lipid panel  Result Value Ref Range   Triglycerides 89 40 - 160 mg/dL   Cholesterol 096187 0 - 045200 mg/dL   HDL 66 35 - 70 mg/dL   LDL Cholesterol 409102 mg/dL      Assessment & Plan:   Problem List Items Addressed This Visit      Unprioritized   Cystocele with uterine prolapse    Refer back to gyn      Relevant Orders   Ambulatory referral to Gynecology    Other Visit Diagnoses    Lower abdominal pain    -  Primary   Rx for Cipro and Flagyl.  Suspect ovarian cyst.  Refer to gyn   Relevant Orders   UA/M w/rflx Culture, Routine   Acute vaginitis       Relevant Orders   WET PREP FOR TRICH, YEAST, CLUE   Cervicitis       refusing GC and Chlamydia.  Rx for Cipro BID and Flagy   Acute cystitis with hematuria       Await culture.  Rx for Cipro already       Follow up plan: Return for f/u with gyn.

## 2016-01-21 NOTE — Assessment & Plan Note (Signed)
Refer back to gyn.   

## 2016-01-21 NOTE — Addendum Note (Signed)
Addended by: Gabriel CirriWICKER, Lamin Chandley on: 01/21/2016 11:21 AM   Modules accepted: Orders

## 2016-01-23 ENCOUNTER — Telehealth: Payer: Self-pay | Admitting: Unknown Physician Specialty

## 2016-01-23 LAB — UA/M W/RFLX CULTURE, ROUTINE
Bilirubin, UA: NEGATIVE
Glucose, UA: NEGATIVE
Ketones, UA: NEGATIVE
NITRITE UA: NEGATIVE
PH UA: 6.5 (ref 5.0–7.5)
Protein, UA: NEGATIVE
Specific Gravity, UA: 1.005 (ref 1.005–1.030)
Urobilinogen, Ur: 0.2 mg/dL (ref 0.2–1.0)

## 2016-01-23 LAB — URINE CULTURE, REFLEX

## 2016-01-23 LAB — MICROSCOPIC EXAMINATION

## 2016-01-23 LAB — WET PREP FOR TRICH, YEAST, CLUE
Clue Cell Exam: NEGATIVE
Trichomonas Exam: NEGATIVE
Yeast Exam: NEGATIVE

## 2016-01-23 NOTE — Telephone Encounter (Signed)
Discussed with pt blood in urine.  She started a heavy period the day following.  Recommended stopping antibiotics as having mouth symptoms.

## 2016-01-25 LAB — IGP, APTIMA HPV, RFX 16/18,45
HPV Aptima: NEGATIVE
PAP Smear Comment: 0

## 2016-01-28 ENCOUNTER — Encounter: Payer: Self-pay | Admitting: Unknown Physician Specialty

## 2016-01-31 DIAGNOSIS — D235 Other benign neoplasm of skin of trunk: Secondary | ICD-10-CM | POA: Diagnosis not present

## 2016-01-31 DIAGNOSIS — D2339 Other benign neoplasm of skin of other parts of face: Secondary | ICD-10-CM | POA: Diagnosis not present

## 2016-01-31 DIAGNOSIS — D1801 Hemangioma of skin and subcutaneous tissue: Secondary | ICD-10-CM | POA: Diagnosis not present

## 2016-02-05 DIAGNOSIS — R102 Pelvic and perineal pain: Secondary | ICD-10-CM | POA: Diagnosis not present

## 2016-02-05 DIAGNOSIS — N814 Uterovaginal prolapse, unspecified: Secondary | ICD-10-CM | POA: Diagnosis not present

## 2016-02-05 DIAGNOSIS — N941 Unspecified dyspareunia: Secondary | ICD-10-CM | POA: Diagnosis not present

## 2016-02-05 DIAGNOSIS — N393 Stress incontinence (female) (male): Secondary | ICD-10-CM | POA: Diagnosis not present

## 2016-02-06 DIAGNOSIS — R102 Pelvic and perineal pain: Secondary | ICD-10-CM | POA: Diagnosis not present

## 2016-02-11 ENCOUNTER — Telehealth: Payer: Self-pay | Admitting: Family Medicine

## 2016-02-12 DIAGNOSIS — R102 Pelvic and perineal pain: Secondary | ICD-10-CM | POA: Diagnosis not present

## 2016-02-12 DIAGNOSIS — N941 Unspecified dyspareunia: Secondary | ICD-10-CM | POA: Diagnosis not present

## 2016-02-12 DIAGNOSIS — N814 Uterovaginal prolapse, unspecified: Secondary | ICD-10-CM | POA: Diagnosis not present

## 2016-02-12 NOTE — Telephone Encounter (Signed)
Pt called regarding [facility] bill.  Received bill for $36.00.  Will contact [facility] for additional details per patient's request.

## 2016-02-18 NOTE — H&P (Signed)
[name] is a 50 y.o. female here for Discuss sono . Pt with uterine descensus and s/p ablation ( 2015)  with intermittent left pelvic pain and dyspareunia ( intermittent) to left . This pain preceded her ablation .   U/S on 02/08/16 showed simple left ovarian cyst 1 cm and stripe 9 mm.   Past Medical History:  has a past medical history of Abnormal uterine bleeding, unspecified; Anemia, unspecified; History of fall (10/2011); History of miscarriage (2006); Menometrorrhagia; and Tachycardia, unspecified.  Past Surgical History:  has a past surgical history that includes Cesarean section, low transverse; Endometrial ablation; and Endometrial biopsy  (05/18/2013 by Milon Scorearon Jones). Family History: family history includes Breast cancer in her maternal aunt; Diabetes in her other; Heart disease in her mother; High blood pressure (Hypertension) in her mother; Hyperlipidemia (Elevated cholesterol) in her mother; Myocardial Infarction (Heart attack) in her maternal grandfather; Stroke in her maternal grandfather. Social History:  reports that she has never smoked. She has never used smokeless tobacco. She reports that she does not drink alcohol or use drugs. OB/GYN History:          OB History    Gravida Para Term Preterm AB Living   5 4 4   1 4    SAB TAB Ectopic Molar Multiple Live Births     1              Allergies: is allergic to latex, natural rubber. Medications:  Current Outpatient Prescriptions:  .  CYANOCOBALAMIN, VITAMIN B-12, ORAL, Take by mouth., Disp: , Rfl:  .  magnesium 250 mg Tab, Take by mouth., Disp: , Rfl:   Review of Systems: General:                      No fatigue or weight loss Eyes:                           No vision changes Ears:                            No hearing difficulty Respiratory:                No cough or shortness of breath Pulmonary:                  No asthma or shortness of breath Cardiovascular:           No chest pain, palpitations,  dyspnea on exertion Gastrointestinal:          No abdominal bloating, chronic diarrhea, constipations, masses, pain or hematochezia Genitourinary:             No hematuria, dysuria, abnormal vaginal discharge, pelvic pain, Menometrorrhagia Lymphatic:                   No swollen lymph nodes Musculoskeletal:         No muscle weakness Neurologic:                  No extremity weakness, syncope, seizure disorder Psychiatric:                  No history of depression, delusions or suicidal/homicidal ideation    Exam:      Vitals:   02/12/16 1547  BP: 118/69  Pulse: 81    Body mass index is 33.3 kg/m.  WDWN white/ female in NAD  Lungs: CTA  CV : RRR without murmur   Neck: no thyromegaly Abdomen: soft , no mass, normal active bowel sounds, non-tender, no rebound tenderness Pelvic: tanner stage 5 ,  External genitalia: vulva /labia no lesions Urethra: no prolapse Vagina: normal physiologic d/c, first degree cystocele + rectocele , adequate room for TVH / LAVH  Cervix: no lesions, no cervical motion tenderness, descends to hymen Uterus: normal size shape and contour, non-tender, 2-3 descent  Adnexa:no mass, non-tender  Rectovaginal  Impression:   The primary encounter diagnosis was Female pelvic pain. Diagnoses of Dyspareunia, female and Uterus descensus were also pertinent to this visit. No identifiable source for left pelvic pain. Small left ovarian cyst benign appearing .    Plan:   I have spoken with the patient regarding treatment options including expectant management,, or surgical intervention. After a full discussion the pt elects to proceed with LAVH / bilateral salpingectomy  Benefits and risks to surgery: The proposed benefit of the surgery has been discussed with the patient. The possible risks include, but are not limited to: organ injury to the bowel , bladder, ureters, and major blood vessels and nerves. There is a possibility of additional  surgeries resulting from these injuries. There is also the risk of blood transfusion and the need to receive blood products during or after the procedure which may rarely lead to HIV or Hepatitis C infection. There is a risk of developing a deep venous thrombosis or a pulmonary embolism . There is the possibility of wound infection and also anesthetic complications, even the rare possibility of death. The patient understands these risks and wishes to proceed. All questions have been answered and the consent has been signed.    This is a note placed for Dr. Feliberto GottronSchermerhorn from the preop note

## 2016-02-19 ENCOUNTER — Encounter
Admission: RE | Admit: 2016-02-19 | Discharge: 2016-02-19 | Disposition: A | Discharge status: Home / Self Care | Payer: BLUE CROSS/BLUE SHIELD | Source: Ambulatory Visit | Attending: Obstetrics and Gynecology | Admitting: Obstetrics and Gynecology

## 2016-02-19 DIAGNOSIS — Z01812 Encounter for preprocedural laboratory examination: Secondary | ICD-10-CM | POA: Diagnosis not present

## 2016-02-19 DIAGNOSIS — N814 Uterovaginal prolapse, unspecified: Secondary | ICD-10-CM | POA: Insufficient documentation

## 2016-02-19 HISTORY — DX: Anxiety disorder, unspecified: F41.9

## 2016-02-19 HISTORY — DX: Headache, unspecified: R51.9

## 2016-02-19 HISTORY — DX: Headache: R51

## 2016-02-19 LAB — TYPE AND SCREEN
ABO/RH(D): A POS
ANTIBODY SCREEN: NEGATIVE

## 2016-02-19 LAB — BASIC METABOLIC PANEL
ANION GAP: 9 (ref 5–15)
BUN: 10 mg/dL (ref 6–20)
CALCIUM: 9.5 mg/dL (ref 8.9–10.3)
CO2: 27 mmol/L (ref 22–32)
Chloride: 103 mmol/L (ref 101–111)
Creatinine, Ser: 0.63 mg/dL (ref 0.44–1.00)
Glucose, Bld: 84 mg/dL (ref 65–99)
POTASSIUM: 3.4 mmol/L — AB (ref 3.5–5.1)
SODIUM: 139 mmol/L (ref 135–145)

## 2016-02-19 LAB — CBC
HEMATOCRIT: 39.2 % (ref 35.0–47.0)
HEMOGLOBIN: 13.1 g/dL (ref 12.0–16.0)
MCH: 28.7 pg (ref 26.0–34.0)
MCHC: 33.4 g/dL (ref 32.0–36.0)
MCV: 85.9 fL (ref 80.0–100.0)
Platelets: 283 10*3/uL (ref 150–440)
RBC: 4.57 MIL/uL (ref 3.80–5.20)
RDW: 14.6 % — ABNORMAL HIGH (ref 11.5–14.5)
WBC: 7.4 10*3/uL (ref 3.6–11.0)

## 2016-02-19 NOTE — Patient Instructions (Signed)
  Your procedure is scheduled on: February 26, 2016 (Tuesday) Report to Same Day Surgery 2nd floor medical mall [facility](Medical Mall Entrance-take elevator on left to 2nd floor.  Check in with surgery information desk.) To find out your arrival time please call [phone] between 1PM - 3PM on  February 22, 2016 (Friday)  Remember: Instructions that are not followed completely may result in serious medical risk, up to and including death, or upon the discretion of your surgeon and anesthesiologist your surgery may need to be rescheduled.    _x___ 1. Do not eat food or drink liquids after midnight. No gum chewing or hard candies.     __x__ 2. No Alcohol for 24 hours before or after surgery.   __x__3. No Smoking for 24 prior to surgery.   ____  4. Bring all medications with you on the day of surgery if instructed.    __x__ 5. Notify your doctor if there is any change in your medical condition     (cold, fever, infections).     Do not wear jewelry, make-up, hairpins, clips or nail polish.  Do not wear lotions, powders, or perfumes. You may wear deodorant.  Do not shave 48 hours prior to surgery. Men may shave face and neck.  Do not bring valuables to the hospital.    [facility] is not responsible for any belongings or valuables.               Contacts, dentures or bridgework may not be worn into surgery.  Leave your suitcase in the car. After surgery it may be brought to your room.  For patients admitted to the hospital, discharge time is determined by your treatment team.   Patients discharged the day of surgery will not be allowed to drive home.  You will need someone to drive you home and stay with you the night of your procedure.    Please read over the following fact sheets that you were given:   [facility] Preparing for Surgery and or MRSA Information   ___ Take these medicines the morning of surgery with A SIP OF WATER:    1.   2.  3.  4.  5.  6.  _x___Fleets enema or  Magnesium Citrate as directed. (Follow Dr. Feliberto GottronSchermerhorn instructions regarding enema)   _x___ Use CHG Soap or sage wipes as directed on instruction sheet   ____ Use inhalers on the day of surgery and bring to hospital day of surgery  ____ Stop metformin 2 days prior to surgery    ____ Take 1/2 of usual insulin dose the night before surgery and none on the morning of           surgery.   _x_ Stop Aspirin, Coumadin, Pllavix ,Eliquis, Effient, or Pradaxa (NO ASPIRIN)  x__ Stop Anti-inflammatories such as Advil, Aleve, Ibuprofen, Motrin, Naproxen,          Naprosyn, Goodies powders or aspirin products. Ok to take Tylenol.   __x__ Stop supplements until after surgery.  (Stop Vitamin B-12, and Omega-3, and all essential  oils now)  ___ Bring C-Pap to the hospital.

## 2016-02-19 NOTE — Pre-Procedure Instructions (Signed)
Patient spoke with Dr. Feliberto GottronSchermerhorn office regarding red irritated area in pubic area.

## 2016-02-21 ENCOUNTER — Telehealth: Payer: Self-pay | Admitting: Family Medicine

## 2016-02-21 NOTE — Telephone Encounter (Signed)
Called patient back regarding the [facility] bill for DOS 07/02/15.

## 2016-02-24 NOTE — H&P (Signed)
Kathy Reeves is a 50 y.o. female here for LAVH / bilateral salpingectomy   Pt with uterine descensus and s/p ablation ( 2015)  with intermittent left pelvic pain and dyspareunia ( intermittent) to left . This pain preceded her ablation .   U/S on 02/08/16 showed simple left ovarian cyst 1 cm and stripe 9 mm.   Past Medical History:  has a past medical history of Abnormal uterine bleeding, unspecified; Anemia, unspecified; History of fall (10/2011); History of miscarriage (2006); Menometrorrhagia; and Tachycardia, unspecified.  Past Surgical History:  has a past surgical history that includes Cesarean section, low transverse; Endometrial ablation; and Endometrial biopsy  (05/18/2013 by Milon Scorearon Jones). Family History: family history includes Breast cancer in her maternal aunt; Diabetes in her other; Heart disease in her mother; High blood pressure (Hypertension) in her mother; Hyperlipidemia (Elevated cholesterol) in her mother; Myocardial Infarction (Heart attack) in her maternal grandfather; Stroke in her maternal grandfather. Social History:  reports that she has never smoked. She has never used smokeless tobacco. She reports that she does not drink alcohol or use drugs. OB/GYN History:          OB History    Gravida Para Term Preterm AB Living   5 4 4   1 4    SAB TAB Ectopic Molar Multiple Live Births     1              Allergies: is allergic to latex, natural rubber. Medications:  Current Outpatient Prescriptions:  .  CYANOCOBALAMIN, VITAMIN B-12, ORAL, Take by mouth., Disp: , Rfl:  .  magnesium 250 mg Tab, Take by mouth., Disp: , Rfl:   Review of Systems: General:                      No fatigue or weight loss Eyes:                           No vision changes Ears:                            No hearing difficulty Respiratory:                No cough or shortness of breath Pulmonary:                  No asthma or shortness of breath Cardiovascular:           No chest pain,  palpitations, dyspnea on exertion Gastrointestinal:          No abdominal bloating, chronic diarrhea, constipations, masses, pain or hematochezia Genitourinary:             No hematuria, dysuria, abnormal vaginal discharge, pelvic pain, Menometrorrhagia Lymphatic:                   No swollen lymph nodes Musculoskeletal:         No muscle weakness Neurologic:                  No extremity weakness, syncope, seizure disorder Psychiatric:                  No history of depression, delusions or suicidal/homicidal ideation    Exam:      Vitals:   02/12/16 1547  BP: 118/69  Pulse: 81    Body mass index is 33.3 kg/m.  WDWN white/  female in NAD   Lungs: CTA  CV : RRR without murmur   Neck: no thyromegaly Abdomen: soft , no mass, normal active bowel sounds, non-tender, no rebound tenderness Pelvic: tanner stage 5 ,  External genitalia: vulva /labia no lesions Urethra: no prolapse Vagina: normal physiologic d/c, first degree cystocele + rectocele , adequate room for TVH / LAVH  Cervix: no lesions, no cervical motion tenderness, descends to hymen Uterus: normal size shape and contour, non-tender, 2-3 descent  Adnexa:no mass, non-tender  Rectovaginal  Impression:   The primary encounter diagnosis was Female pelvic pain. Diagnoses of Dyspareunia, female and Uterus descensus were also pertinent to this visit. No identifiable source for left pelvic pain. Small left ovarian cyst benign appearing .    Plan:   I have spoken with the patient regarding treatment options including expectant management,, or surgical intervention. After a full discussion the pt elects to proceed with LAVH / bilateral salpingectomy  Benefits and risks to surgery: The proposed benefit of the surgery has been discussed with the patient. The possible risks include, but are not limited to: organ injury to the bowel , bladder, ureters, and major blood vessels and nerves. There is a possibility of  additional surgeries resulting from these injuries. There is also the risk of blood transfusion and the need to receive blood products during or after the procedure which may rarely lead to HIV or Hepatitis C infection. There is a risk of developing a deep venous thrombosis or a pulmonary embolism . There is the possibility of wound infection and also anesthetic complications, even the rare possibility of death. The patient understands these risks and wishes to proceed. All questions have been answered and the consent has been signed.

## 2016-02-25 MED ORDER — CEFAZOLIN SODIUM-DEXTROSE 2-4 GM/100ML-% IV SOLN
2.0000 g | INTRAVENOUS | Status: AC
  Administered 2016-02-26: 2 g via INTRAVENOUS

## 2016-02-26 ENCOUNTER — Encounter: Payer: Self-pay | Admitting: *Deleted

## 2016-02-26 ENCOUNTER — Ambulatory Visit: Payer: BLUE CROSS/BLUE SHIELD | Admitting: Anesthesiology

## 2016-02-26 ENCOUNTER — Encounter
Admission: RE | Disposition: A | Discharge status: Home / Self Care | Payer: Self-pay | Source: Ambulatory Visit | Attending: Obstetrics and Gynecology

## 2016-02-26 ENCOUNTER — Observation Stay
Admission: RE | Admit: 2016-02-26 | Discharge: 2016-02-27 | Disposition: A | Discharge status: Home / Self Care | Payer: BLUE CROSS/BLUE SHIELD | Source: Ambulatory Visit | Attending: Obstetrics and Gynecology | Admitting: Obstetrics and Gynecology

## 2016-02-26 DIAGNOSIS — K219 Gastro-esophageal reflux disease without esophagitis: Secondary | ICD-10-CM | POA: Insufficient documentation

## 2016-02-26 DIAGNOSIS — R102 Pelvic and perineal pain: Secondary | ICD-10-CM | POA: Insufficient documentation

## 2016-02-26 DIAGNOSIS — N814 Uterovaginal prolapse, unspecified: Secondary | ICD-10-CM | POA: Diagnosis not present

## 2016-02-26 DIAGNOSIS — N800 Endometriosis of uterus: Principal | ICD-10-CM | POA: Insufficient documentation

## 2016-02-26 DIAGNOSIS — F419 Anxiety disorder, unspecified: Secondary | ICD-10-CM | POA: Diagnosis not present

## 2016-02-26 DIAGNOSIS — N736 Female pelvic peritoneal adhesions (postinfective): Secondary | ICD-10-CM | POA: Diagnosis not present

## 2016-02-26 DIAGNOSIS — Z9889 Other specified postprocedural states: Secondary | ICD-10-CM

## 2016-02-26 DIAGNOSIS — N941 Unspecified dyspareunia: Secondary | ICD-10-CM | POA: Insufficient documentation

## 2016-02-26 DIAGNOSIS — N812 Incomplete uterovaginal prolapse: Secondary | ICD-10-CM | POA: Diagnosis not present

## 2016-02-26 HISTORY — PX: LAPAROSCOPIC VAGINAL HYSTERECTOMY WITH SALPINGECTOMY: SHX6680

## 2016-02-26 LAB — BASIC METABOLIC PANEL
ANION GAP: 6 (ref 5–15)
BUN: 9 mg/dL (ref 6–20)
CHLORIDE: 103 mmol/L (ref 101–111)
CO2: 26 mmol/L (ref 22–32)
Calcium: 8.9 mg/dL (ref 8.9–10.3)
Creatinine, Ser: 0.63 mg/dL (ref 0.44–1.00)
Glucose, Bld: 127 mg/dL — ABNORMAL HIGH (ref 65–99)
POTASSIUM: 3.8 mmol/L (ref 3.5–5.1)
SODIUM: 135 mmol/L (ref 135–145)

## 2016-02-26 LAB — POCT PREGNANCY, URINE: Preg Test, Ur: NEGATIVE

## 2016-02-26 LAB — ABO/RH: ABO/RH(D): A POS

## 2016-02-26 SURGERY — HYSTERECTOMY, VAGINAL, LAPAROSCOPY-ASSISTED, WITH SALPINGECTOMY
Anesthesia: General | Laterality: Bilateral

## 2016-02-26 MED ORDER — DEXAMETHASONE SODIUM PHOSPHATE 10 MG/ML IJ SOLN
INTRAMUSCULAR | Status: AC
  Filled 2016-02-26: qty 1

## 2016-02-26 MED ORDER — HYDROCODONE-ACETAMINOPHEN 5-325 MG PO TABS
1.0000 | ORAL_TABLET | ORAL | Status: DC | PRN
  Administered 2016-02-26 – 2016-02-27 (×2): 1 via ORAL
  Filled 2016-02-26 (×2): qty 1

## 2016-02-26 MED ORDER — LACTATED RINGERS IV SOLN: INTRAVENOUS | Status: DC

## 2016-02-26 MED ORDER — ONDANSETRON HCL 4 MG/2ML IJ SOLN: 4.0000 mg | Freq: Once | INTRAMUSCULAR | Status: DC | PRN

## 2016-02-26 MED ORDER — BUPIVACAINE HCL (PF) 0.5 % IJ SOLN
INTRAMUSCULAR | Status: AC
  Filled 2016-02-26: qty 30

## 2016-02-26 MED ORDER — DEXAMETHASONE SODIUM PHOSPHATE 10 MG/ML IJ SOLN
INTRAMUSCULAR | Status: DC | PRN
  Administered 2016-02-26: 10 mg via INTRAVENOUS

## 2016-02-26 MED ORDER — ONDANSETRON HCL 4 MG/2ML IJ SOLN: 4.0000 mg | Freq: Four times a day (QID) | INTRAMUSCULAR | Status: DC | PRN

## 2016-02-26 MED ORDER — MORPHINE SULFATE (PF) 2 MG/ML IV SOLN: 1.0000 mg | INTRAVENOUS | Status: DC | PRN

## 2016-02-26 MED ORDER — SUGAMMADEX SODIUM 200 MG/2ML IV SOLN
INTRAVENOUS | Status: DC | PRN
  Administered 2016-02-26: 180 mg via INTRAVENOUS

## 2016-02-26 MED ORDER — BUPIVACAINE HCL 0.5 % IJ SOLN
INTRAMUSCULAR | Status: DC | PRN
  Administered 2016-02-26: 10 mL

## 2016-02-26 MED ORDER — FENTANYL CITRATE (PF) 100 MCG/2ML IJ SOLN: 25.0000 ug | INTRAMUSCULAR | Status: DC | PRN

## 2016-02-26 MED ORDER — ONDANSETRON HCL 4 MG PO TABS: 4.0000 mg | ORAL_TABLET | Freq: Four times a day (QID) | ORAL | Status: DC | PRN

## 2016-02-26 MED ORDER — MIDAZOLAM HCL 2 MG/2ML IJ SOLN
INTRAMUSCULAR | Status: AC
  Filled 2016-02-26: qty 2

## 2016-02-26 MED ORDER — FAMOTIDINE 20 MG PO TABS
ORAL_TABLET | ORAL | Status: AC
  Administered 2016-02-26: 20 mg via ORAL
  Filled 2016-02-26: qty 1

## 2016-02-26 MED ORDER — SUGAMMADEX SODIUM 200 MG/2ML IV SOLN
INTRAVENOUS | Status: AC
  Filled 2016-02-26: qty 2

## 2016-02-26 MED ORDER — SUCCINYLCHOLINE CHLORIDE 200 MG/10ML IV SOSY
PREFILLED_SYRINGE | INTRAVENOUS | Status: AC
  Filled 2016-02-26: qty 10

## 2016-02-26 MED ORDER — PROPOFOL 10 MG/ML IV BOLUS
INTRAVENOUS | Status: AC
  Filled 2016-02-26: qty 20

## 2016-02-26 MED ORDER — SODIUM CHLORIDE 0.9% FLUSH
3.0000 mL | INTRAVENOUS | Status: DC | PRN
  Administered 2016-02-26: 3 mL via INTRAVENOUS
  Filled 2016-02-26: qty 3

## 2016-02-26 MED ORDER — LACTATED RINGERS IV SOLN
INTRAVENOUS | Status: DC
  Administered 2016-02-26 (×2): via INTRAVENOUS

## 2016-02-26 MED ORDER — PHENYLEPHRINE HCL 10 MG/ML IJ SOLN
INTRAMUSCULAR | Status: DC | PRN
  Administered 2016-02-26: 80 ug via INTRAVENOUS

## 2016-02-26 MED ORDER — SODIUM CHLORIDE 0.9 % IV SOLN: 250.0000 mL | INTRAVENOUS | Status: DC | PRN

## 2016-02-26 MED ORDER — SODIUM CHLORIDE 0.9% FLUSH: 3.0000 mL | Freq: Two times a day (BID) | INTRAVENOUS | Status: DC

## 2016-02-26 MED ORDER — ONDANSETRON HCL 4 MG/2ML IJ SOLN
INTRAMUSCULAR | Status: AC
  Filled 2016-02-26: qty 2

## 2016-02-26 MED ORDER — ACETAMINOPHEN 10 MG/ML IV SOLN
INTRAVENOUS | Status: AC
  Filled 2016-02-26: qty 100

## 2016-02-26 MED ORDER — ACETAMINOPHEN 10 MG/ML IV SOLN
INTRAVENOUS | Status: DC | PRN
  Administered 2016-02-26: 1000 mg via INTRAVENOUS

## 2016-02-26 MED ORDER — FENTANYL CITRATE (PF) 100 MCG/2ML IJ SOLN
INTRAMUSCULAR | Status: DC | PRN
  Administered 2016-02-26: 50 ug via INTRAVENOUS
  Administered 2016-02-26: 100 ug via INTRAVENOUS
  Administered 2016-02-26: 50 ug via INTRAVENOUS

## 2016-02-26 MED ORDER — KETOROLAC TROMETHAMINE 30 MG/ML IJ SOLN
30.0000 mg | Freq: Three times a day (TID) | INTRAMUSCULAR | Status: DC | PRN
Start: 2016-02-26 — End: 2016-02-27

## 2016-02-26 MED ORDER — EPHEDRINE 5 MG/ML INJ
INTRAVENOUS | Status: AC
  Filled 2016-02-26: qty 10

## 2016-02-26 MED ORDER — MIDAZOLAM HCL 2 MG/2ML IJ SOLN
INTRAMUSCULAR | Status: DC | PRN
  Administered 2016-02-26: 2 mg via INTRAVENOUS

## 2016-02-26 MED ORDER — CEFAZOLIN SODIUM-DEXTROSE 2-4 GM/100ML-% IV SOLN
INTRAVENOUS | Status: AC
  Administered 2016-02-26: 2 g via INTRAVENOUS
  Filled 2016-02-26: qty 100

## 2016-02-26 MED ORDER — PHENYLEPHRINE 40 MCG/ML (10ML) SYRINGE FOR IV PUSH (FOR BLOOD PRESSURE SUPPORT)
PREFILLED_SYRINGE | INTRAVENOUS | Status: AC
  Filled 2016-02-26: qty 10

## 2016-02-26 MED ORDER — LIDOCAINE HCL (CARDIAC) 20 MG/ML IV SOLN
INTRAVENOUS | Status: DC | PRN
  Administered 2016-02-26: 100 mg via INTRAVENOUS

## 2016-02-26 MED ORDER — FENTANYL CITRATE (PF) 100 MCG/2ML IJ SOLN
INTRAMUSCULAR | Status: AC
  Filled 2016-02-26: qty 2

## 2016-02-26 MED ORDER — KETOROLAC TROMETHAMINE 30 MG/ML IJ SOLN
30.0000 mg | Freq: Three times a day (TID) | INTRAMUSCULAR | Status: DC | PRN
  Administered 2016-02-26 – 2016-02-27 (×3): 30 mg via INTRAVENOUS
  Filled 2016-02-26 (×3): qty 1

## 2016-02-26 MED ORDER — ONDANSETRON HCL 4 MG/2ML IJ SOLN
INTRAMUSCULAR | Status: DC | PRN
  Administered 2016-02-26: 4 mg via INTRAVENOUS

## 2016-02-26 MED ORDER — LIDOCAINE-EPINEPHRINE 1 %-1:100000 IJ SOLN
INTRAMUSCULAR | Status: DC | PRN
  Administered 2016-02-26: 10 mL

## 2016-02-26 MED ORDER — PROPOFOL 10 MG/ML IV BOLUS
INTRAVENOUS | Status: DC | PRN
  Administered 2016-02-26: 150 mg via INTRAVENOUS

## 2016-02-26 MED ORDER — LIDOCAINE 2% (20 MG/ML) 5 ML SYRINGE
INTRAMUSCULAR | Status: AC
  Filled 2016-02-26: qty 5

## 2016-02-26 MED ORDER — ROCURONIUM BROMIDE 100 MG/10ML IV SOLN
INTRAVENOUS | Status: DC | PRN
  Administered 2016-02-26: 50 mg via INTRAVENOUS

## 2016-02-26 MED ORDER — LIDOCAINE-EPINEPHRINE 1 %-1:100000 IJ SOLN
INTRAMUSCULAR | Status: AC
  Filled 2016-02-26: qty 1

## 2016-02-26 MED ORDER — SUCCINYLCHOLINE CHLORIDE 20 MG/ML IJ SOLN
INTRAMUSCULAR | Status: DC | PRN
  Administered 2016-02-26: 100 mg via INTRAVENOUS

## 2016-02-26 MED ORDER — FAMOTIDINE 20 MG PO TABS
20.0000 mg | ORAL_TABLET | Freq: Once | ORAL | Status: AC
  Administered 2016-02-26: 20 mg via ORAL

## 2016-02-26 SURGICAL SUPPLY — 46 items
APPLICATOR ARISTA FLEXITIP XL (MISCELLANEOUS) ×3 IMPLANT
BAG URO DRAIN 2000ML W/SPOUT (MISCELLANEOUS) ×3 IMPLANT
BLADE SURG SZ11 CARB STEEL (BLADE) ×3 IMPLANT
CATH FOLEY 2WAY  5CC 16FR (CATHETERS) ×2
CATH URTH 16FR FL 2W BLN LF (CATHETERS) ×1 IMPLANT
CHLORAPREP W/TINT 26ML (MISCELLANEOUS) ×3 IMPLANT
CLOSURE WOUND 1/2 X4 (GAUZE/BANDAGES/DRESSINGS) ×1
DEFOGGER SCOPE WARMER CLEARIFY (MISCELLANEOUS) ×3 IMPLANT
DRAPE SURG 17X11 SM STRL (DRAPES) ×3 IMPLANT
DRSG TEGADERM 2X2.25 PEDS (GAUZE/BANDAGES/DRESSINGS) ×3 IMPLANT
ELECT REM PT RETURN 9FT ADLT (ELECTROSURGICAL) ×3
ELECTRODE REM PT RTRN 9FT ADLT (ELECTROSURGICAL) ×1 IMPLANT
FILTER LAP SMOKE EVAC STRL (MISCELLANEOUS) ×3 IMPLANT
GLOVE BIO SURGEON STRL SZ8 (GLOVE) ×24 IMPLANT
GOWN STRL REUS W/ TWL LRG LVL3 (GOWN DISPOSABLE) ×3 IMPLANT
GOWN STRL REUS W/ TWL XL LVL3 (GOWN DISPOSABLE) ×3 IMPLANT
GOWN STRL REUS W/TWL LRG LVL3 (GOWN DISPOSABLE) ×6
GOWN STRL REUS W/TWL XL LVL3 (GOWN DISPOSABLE) ×6
HANDLE YANKAUER SUCT BULB TIP (MISCELLANEOUS) ×3 IMPLANT
HEMOSTAT ARISTA ABSORB 3G PWDR (MISCELLANEOUS) ×3 IMPLANT
IRRIGATION STRYKERFLOW (MISCELLANEOUS) ×1 IMPLANT
IRRIGATOR STRYKERFLOW (MISCELLANEOUS) ×3
KIT PINK PAD W/HEAD ARE REST (MISCELLANEOUS) ×3
KIT PINK PAD W/HEAD ARM REST (MISCELLANEOUS) ×1 IMPLANT
KIT RM TURNOVER CYSTO AR (KITS) ×3 IMPLANT
LABEL OR SOLS (LABEL) ×3 IMPLANT
LIQUID BAND (GAUZE/BANDAGES/DRESSINGS) ×3 IMPLANT
PACK BASIN MINOR ARMC (MISCELLANEOUS) ×3 IMPLANT
PACK GYN LAPAROSCOPIC (MISCELLANEOUS) ×3 IMPLANT
PAD OB MATERNITY 4.3X12.25 (PERSONAL CARE ITEMS) ×3 IMPLANT
SCISSORS METZENBAUM CVD 33 (INSTRUMENTS) ×3 IMPLANT
SHEARS HARMONIC ACE PLUS 36CM (ENDOMECHANICALS) ×3 IMPLANT
SLEEVE ENDOPATH XCEL 5M (ENDOMECHANICALS) ×3 IMPLANT
SPONGE XRAY 4X4 16PLY STRL (MISCELLANEOUS) ×3 IMPLANT
STRIP CLOSURE SKIN 1/2X4 (GAUZE/BANDAGES/DRESSINGS) ×2 IMPLANT
SUT VIC AB 0 CT1 27 (SUTURE) ×4
SUT VIC AB 0 CT1 27XCR 8 STRN (SUTURE) ×2 IMPLANT
SUT VIC AB 0 CT1 36 (SUTURE) ×6 IMPLANT
SUT VIC AB 0 CT2 27 (SUTURE) ×3 IMPLANT
SUT VIC AB 4-0 SH 27 (SUTURE) ×2
SUT VIC AB 4-0 SH 27XANBCTRL (SUTURE) ×1 IMPLANT
SYR CONTROL 10ML (SYRINGE) ×3 IMPLANT
SYRINGE 10CC LL (SYRINGE) ×3 IMPLANT
TROCAR ENDO BLADELESS 11MM (ENDOMECHANICALS) ×3 IMPLANT
TROCAR XCEL NON-BLD 5MMX100MML (ENDOMECHANICALS) ×3 IMPLANT
TUBING INSUFFLATOR HEATED (MISCELLANEOUS) ×3 IMPLANT

## 2016-02-26 NOTE — Op Note (Signed)
[facility]:  Reeves, Kathy                ACCOUNT NO.:  [id number]  MEDICAL RECORD NO.:  [id number]  LOCATION:                                 FACILITY:  PHYSICIAN:  Jennell Cornerhomas Dinesh Ulysse, MDDATE OF BIRTH:  [birth year]  DATE OF PROCEDURE: DATE OF DISCHARGE:                              OPERATIVE REPORT   PREOPERATIVE DIAGNOSIS: 1. Uterine descensus. 2. Left pelvic pain. 3. Dyspareunia.  POSTOPERATIVE DIAGNOSIS: 1. Uterine descensus. 2. Left pelvic pain. 3. Dyspareunia. 4. Minimal left pelvic sidewall adhesions.  PROCEDURE: 1. Laparoscopic assisted vaginal hysterectomy. 2. Lysis of adhesions. 3. Bilateral salpingectomy.  ANESTHESIA:  General endotracheal anesthesia.  FIRST ASSISTANT:  Dalbert GarnetBeasley.  INDICATIONS:  A 50 year old, gravida 5, para 4 patient with a history of intermittent left pelvic pain and dyspareunia.  The patient is status post endometrial ablation and this pain preceded the endometrial ablation procedure.  The patient is noted to have grade 2-3 uterine descensus on examination.  DESCRIPTION OF PROCEDURE:  After adequate general endotracheal anesthesia, patient was placed in dorsal supine position, legs in the [place] stirrups.  Abdomen, perineum, and vagina were prepped and draped in normal sterile fashion.  Time-out was performed.  The patient did receive 2 g IV Ancef prior to commencement of the case.  Straight catheterization of the bladder yielded 400 mL urine.  Gloves were changed and a 15 mm infraumbilical incision was made after injecting with 0.5% Marcaine.  The laparoscope was advanced into the abdominal cavity under direct visualization with the Optiview cannula.  The patient's abdomen was insufflated with carbon dioxide.  Second port site was placed in left lower quadrant, 3 cm medial to the left anterior iliac spine under direct visualization, trocar was advanced into the abdominal cavity.  Likewise, a 3rd port was placed in the right lower quadrant  after injecting with 0.5% Marcaine.  A 5 mm port was advanced 3 cm medial to the right anterior iliac spine on direct visualization was advanced into the abdominal cavity.  Initial impression of the pelvis revealed some minimal adhesions of the left descending colon to the left sidewall.  Otherwise, fallopian tubes, ovaries, and uterus appeared normal.  No lesions noted in the cul-de-sac.  Ureters were identified with normal peristaltic activity.  Attention was then directed to the patient's left adnexa.  Harmonic Scalpel was brought up and these bowel adhesions were taken down sharply.  The left fallopian tube was then grasped at the fimbriated end and the mesosalpinx was dissected with a Harmonic scalpel.  The round ligament was then opened on the left and the uteroovarian ligament was transected and sequential bites through the left broad ligament.  Ultimately, the left uterine artery was identified, skeletonized, and cauterized and transected.  Bladder flap was opened anteriorly and likewise on the right side, the fallopian tube was grasped with the fimbriated end and mesosalpinx was clamped, dissected.  The right round ligament was then opened and the right uterine ovarian ligament was transected and sequential bites through the right broad ligament all the way to the uterine artery which was skeletonized, cauterized, and transected.  Attention was then directed vaginally where thyroid tenacula was placed on the anterior and posterior cervix.  The cervix was circumferentially injected with 0.5% lidocaine with 1:100,000 epinephrine.  Posterior colpotomy incision was made and followed by clamping of the uterosacral ligaments bilaterally, transectioned, and suture ligated with 0 Vicryl suture.  Anterior cervix was circumscribed with the Bovie and the anterior cul-de-sac was entered sharply.  Cardinal ligaments were bilaterally clamped, transected, suture ligated with 0 Vicryl suture.   Several additional bites were made on each side until they communicated with the previous dissection rather.  The uterus and fallopian tubes were delivered without difficulty.  Good hemostasis was noted.  The vaginal cuff was then closed with 0 Vicryl suture running nonlocking suture.  The patient's abdomen was then re-insufflated and the vaginal cuff appeared hemostatic.  There was slight ooze posterior to the vaginal cuff which was controlled with Harmonic and Arista which was placed intraoperatively.  Pressure was lowered to 7 mmHg and again good hemostasis was noted.  The patient's abdomen was deflated and the infraumbilical incision was closed with a 2-0 Vicryl fascial layer and all skin incisions were closed with interrupted 4-0 Vicryl suture. There were no complications.  ESTIMATED BLOOD LOSS:  100 mL.  URINE OUTPUT:  400 mL, initially with replacement of the Foley and additional 200 mL, totaling 600 mL.  INTRAOPERATIVE FLUIDS:  1000 mL.  Patient was taken to recovery room in good condition.          ______________________________ Jennell Cornerhomas Zaide Mcclenahan, MD     TS/MEDQ  D:  02/26/2016  T:  02/26/2016  Job:  [id number]

## 2016-02-26 NOTE — Progress Notes (Signed)
Pt ready for LAVH + bilateral salpingectomy . NPO labs reviewed / All questions answered

## 2016-02-26 NOTE — Progress Notes (Signed)
Patient ID: Kathy Reeves, female   DOB: 1965-06-16, 50 y.o.   MRN: [id number] DOS  Pain in good control  Some infraumbilical drainage , now stopped  Good UO  O: vss abd : soft NT  Incisions C/D/I  A: stable  P: d/c foley , saline lock IV  Anticipate d/c in am

## 2016-02-26 NOTE — Anesthesia Preprocedure Evaluation (Signed)
Anesthesia Evaluation  Patient identified by MRN, date of birth, ID band Patient awake    Reviewed: Allergy & Precautions, H&P , NPO status , Patient's Chart, lab work & pertinent test results, reviewed documented beta blocker date and time   Airway Mallampati: II  TM Distance: >3 FB Neck ROM: full    Dental  (+) Teeth Intact   Pulmonary neg pulmonary ROS,    Pulmonary exam normal        Cardiovascular negative cardio ROS Normal cardiovascular exam Rhythm:regular Rate:Normal     Neuro/Psych  Headaches, negative neurological ROS  negative psych ROS   GI/Hepatic negative GI ROS, Neg liver ROS, GERD  ,  Endo/Other  negative endocrine ROS  Renal/GU negative Renal ROS  negative genitourinary   Musculoskeletal   Abdominal   Peds  Hematology negative hematology ROS (+)   Anesthesia Other Findings Past Medical History: No date: Allergic rhinitis No date: Anxiety No date: GERD (gastroesophageal reflux disease) No date: Headache Past Surgical History: No date: CESAREAN SECTION No date: uterus ablation BMI    Body Mass Index:  33.30 kg/m     Reproductive/Obstetrics negative OB ROS                             Anesthesia Physical Anesthesia Plan  ASA: II  Anesthesia Plan: General ETT   Post-op Pain Management:    Induction:   Airway Management Planned:   Additional Equipment:   Intra-op Plan:   Post-operative Plan:   Informed Consent: I have reviewed the patients History and Physical, chart, labs and discussed the procedure including the risks, benefits and alternatives for the proposed anesthesia with the patient or authorized representative who has indicated his/her understanding and acceptance.   Dental Advisory Given  Plan Discussed with: CRNA  Anesthesia Plan Comments:         Anesthesia Quick Evaluation

## 2016-02-26 NOTE — Brief Op Note (Signed)
02/26/2016  9:49 AM  PATIENT:  Kathy Reeves  [age] y.o. female  PRE-OPERATIVE DIAGNOSIS:  Uterine desensus, (L) pelvic pain  POST-OPERATIVE DIAGNOSIS:  Uterine desensus, (L) pelvic pain Minimal left side wall adhesions  PROCEDURE:  Procedure(s): LAPAROSCOPIC ASSISTED VAGINAL HYSTERECTOMY WITH SALPINGECTOMY (Bilateral)  SURGEON:  Surgeon(s) and Role:    Suzy Bouchard* Thomas J Schermerhorn, MD - Primary     *PHYSICIAN ASSISTANT: Dalbert GarnetBeasley , MD   ASSISTANTS: scrub tech  ANESTHESIA:   general  EBL:  Total I/O In: 1000 [I.V.:1000] Out: 600 [Urine:500; Blood:100]  BLOOD ADMINISTERED:none  DRAINS: Urinary Catheter (Foley)   LOCAL MEDICATIONS USED:  MARCAINE     SPECIMEN:  Source of Specimen:  uterus , cervix and bilateral fallopian tubes   DISPOSITION OF SPECIMEN:  PATHOLOGY  COUNTS:  YES  TOURNIQUET:  * No tourniquets in log *  DICTATION: .Other Dictation: Dictation Number verbal  PLAN OF CARE: Admit for overnight observation  PATIENT DISPOSITION:  PACU - hemodynamically stable.   Delay start of Pharmacological VTE agent (>24hrs) due to surgical blood loss or risk of bleeding: not applicable

## 2016-02-26 NOTE — Transfer of Care (Signed)
Immediate Anesthesia Transfer of Care Note  Patient: Kathy Reeves  Procedure(s) Performed: Procedure(s): LAPAROSCOPIC ASSISTED VAGINAL HYSTERECTOMY WITH SALPINGECTOMY (Bilateral)  Patient Location: PACU  Anesthesia Type:General  Level of Consciousness: sedated  Airway & Oxygen Therapy: Patient connected to face mask oxygen  Post-op Assessment: Post -op Vital signs reviewed and stable  Post vital signs: stable  Last Vitals:  Vitals:   02/26/16 0633 02/26/16 1003  BP: (!) 143/75 100/62  Pulse: 92 70  Resp: 20 (!) 35  Temp: 37.1 C (!) 36 C    Last Pain:  Vitals:   02/26/16 1003  TempSrc: Temporal      Patients Stated Pain Goal: 2 (02/26/16 [id number])  Complications: No apparent anesthesia complications

## 2016-02-26 NOTE — Anesthesia Procedure Notes (Signed)
Procedure Name: Intubation Date/Time: 02/26/2016 7:45 AM Performed by: Irving BurtonBACHICH, Constantinos Krempasky Pre-anesthesia Checklist: Patient identified, Emergency Drugs available, Suction available and Patient being monitored Patient Re-evaluated:Patient Re-evaluated prior to inductionOxygen Delivery Method: Circle system utilized Preoxygenation: Pre-oxygenation with 100% oxygen Intubation Type: IV induction Ventilation: Mask ventilation without difficulty Laryngoscope Size: Mac and 3 Grade View: Grade II Tube type: Oral Tube size: 7.0 mm Airway Equipment and Method: Bougie stylet Placement Confirmation: ETT inserted through vocal cords under direct vision,  breath sounds checked- equal and bilateral and positive ETCO2 Secured at: 21 cm Tube secured with: Tape Dental Injury: Teeth and Oropharynx as per pre-operative assessment  Difficulty Due To: Difficult Airway- due to anterior larynx Comments: Pt has an eraser size, cream colored spot on inside of right cheek upon D/L. Post intubation the spot was still intact.

## 2016-02-27 DIAGNOSIS — N941 Unspecified dyspareunia: Secondary | ICD-10-CM | POA: Diagnosis not present

## 2016-02-27 DIAGNOSIS — K219 Gastro-esophageal reflux disease without esophagitis: Secondary | ICD-10-CM | POA: Diagnosis not present

## 2016-02-27 DIAGNOSIS — F419 Anxiety disorder, unspecified: Secondary | ICD-10-CM | POA: Diagnosis not present

## 2016-02-27 DIAGNOSIS — N736 Female pelvic peritoneal adhesions (postinfective): Secondary | ICD-10-CM | POA: Diagnosis not present

## 2016-02-27 DIAGNOSIS — R102 Pelvic and perineal pain: Secondary | ICD-10-CM | POA: Diagnosis not present

## 2016-02-27 DIAGNOSIS — N800 Endometriosis of uterus: Secondary | ICD-10-CM | POA: Diagnosis not present

## 2016-02-27 LAB — CBC
HEMATOCRIT: 36.9 % (ref 35.0–47.0)
Hemoglobin: 12.5 g/dL (ref 12.0–16.0)
MCH: 29.2 pg (ref 26.0–34.0)
MCHC: 34 g/dL (ref 32.0–36.0)
MCV: 85.8 fL (ref 80.0–100.0)
Platelets: 314 10*3/uL (ref 150–440)
RBC: 4.3 MIL/uL (ref 3.80–5.20)
RDW: 14.8 % — AB (ref 11.5–14.5)
WBC: 16.5 10*3/uL — AB (ref 3.6–11.0)

## 2016-02-27 MED ORDER — HYDROCODONE-ACETAMINOPHEN 5-325 MG PO TABS: 1.0000 | ORAL_TABLET | ORAL | 0 refills | Status: DC | PRN

## 2016-02-27 MED ORDER — SIMETHICONE 80 MG PO CHEW: 80.0000 mg | CHEWABLE_TABLET | Freq: Four times a day (QID) | ORAL | 0 refills | Status: DC | PRN

## 2016-02-27 MED ORDER — IBUPROFEN 200 MG PO TABS: 800.0000 mg | ORAL_TABLET | Freq: Three times a day (TID) | ORAL | 0 refills | Status: AC | PRN

## 2016-02-27 MED ORDER — DOCUSATE SODIUM 100 MG PO CAPS: 100.0000 mg | ORAL_CAPSULE | Freq: Two times a day (BID) | ORAL | 0 refills | Status: DC

## 2016-02-27 NOTE — Progress Notes (Signed)
Patient discharged home with spouse. Discharge instructions, prescriptions and follow up appointment given to and reviewed with patient and spouse. Patient verbalized understanding. Escorted out via wheelchair by axiliary.  

## 2016-02-27 NOTE — Discharge Summary (Signed)
Physician Discharge Summary  Patient ID: Kathy Reeves MRN: [id number] DOB/AGE: [birth year] 50 y.o.  Admit date: 02/26/2016 Discharge date: 02/27/2016  Admission Diagnoses:uterine descensus, left pelvic pain   Discharge Diagnoses: same Active Problems:   Post-operative state   Discharged Condition: good  Hospital Course: pt underwent an uncomplicated LAVH and bilateral salpingectomy . Post op urinating well , tolerating po food well . Pain is in adequate control  Consults: None  Significant Diagnostic Studies: labs: Hct POD#1 36% , bun / cr 9/0.6   Treatments: analgesia: Vicodin  Discharge Exam: Blood pressure 103/61, pulse 71, temperature 97.8 F (36.6 C), temperature source Oral, resp. rate 18, height 5\' 4"  (1.626 m), weight 194 lb (88 kg), last menstrual period 01/21/2016, SpO2 99 %. General appearance: alert and cooperative Head: Normocephalic, without obvious abnormality, atraumatic Resp: clear to auscultation bilaterally Cardio: regular rate and rhythm, S1, S2 normal, no murmur, click, rub or gallop GI: soft, non-tender; bowel sounds normal; no masses,  no organomegaly  Disposition: Final discharge disposition not confirmed  Discharge Instructions    Call MD for:    Complete by:  As directed    Heavy vaginal bleeding   Call MD for:  difficulty breathing, headache or visual disturbances    Complete by:  As directed    Call MD for:  extreme fatigue    Complete by:  As directed    Call MD for:  hives    Complete by:  As directed    Call MD for:  persistant dizziness or light-headedness    Complete by:  As directed    Call MD for:  persistant nausea and vomiting    Complete by:  As directed    Call MD for:  redness, tenderness, or signs of infection (pain, swelling, redness, odor or green/yellow discharge around incision site)    Complete by:  As directed    Call MD for:  severe uncontrolled pain    Complete by:  As directed    Call MD for:  temperature >100.4     Complete by:  As directed    Diet - low sodium heart healthy    Complete by:  As directed    Increase activity slowly    Complete by:  As directed      Allergies as of 02/27/2016      Reactions   Latex Rash      Medication List    STOP taking these medications   ciprofloxacin 500 MG tablet Commonly known as:  CIPRO   metroNIDAZOLE 500 MG tablet Commonly known as:  FLAGYL     TAKE these medications   ACIDOPHILUS PO Take 1 capsule by mouth daily as needed (for dietary health).   docusate sodium 100 MG capsule Commonly known as:  COLACE Take 1 capsule (100 mg total) by mouth 2 (two) times daily.   Fish Oil 1200 MG Caps Take 1,200 mg by mouth daily as needed (managing heart health).   HYDROcodone-acetaminophen 5-325 MG tablet Commonly known as:  NORCO/VICODIN Take 1-2 tablets by mouth every 4 (four) hours as needed for moderate pain.   ibuprofen 200 MG tablet Commonly known as:  MOTRIN IB Take 4 tablets (800 mg total) by mouth every 8 (eight) hours as needed.   Magnesium 250 MG Tabs Take 250 mg by mouth daily as needed (for dietary supplement).   multivitamin with minerals Tabs tablet Take 1 tablet by mouth daily as needed (for dietary supplement). One-A-Day   NON FORMULARY Apply  1 application topically 4 (four) times daily as needed (for muscle aches/pain). Essential Oils:Marjoram/Lemongrass/Peppermint use as needed for muscles aches & pains   simethicone 80 MG chewable tablet Commonly known as:  GAS-X Chew 1 tablet (80 mg total) by mouth every 6 (six) hours as needed for flatulence.   Vitamin B-12 CR 1000 MCG Tbcr Take 1,000 mcg by mouth daily.   ZYRTEC ALLERGY 10 MG tablet Generic drug:  cetirizine Take 10 mg by mouth daily as needed for allergies.      Follow-up Information    Teala Daffron, MD Follow up in 2 week(s).   Specialty:  Obstetrics and Gynecology Why:  post op Contact information: [address] [facility] [place] [address] [phone]           Signed: Jennell CornerSCHERMERHORN,Laythan Hayter 02/27/2016, 9:25 AM

## 2016-02-28 LAB — SURGICAL PATHOLOGY

## 2016-03-11 NOTE — Anesthesia Postprocedure Evaluation (Signed)
Anesthesia Post Note  Patient: Kathy Reeves  Procedure(s) Performed: Procedure(s) (LRB): LAPAROSCOPIC ASSISTED VAGINAL HYSTERECTOMY WITH SALPINGECTOMY (Bilateral)  Patient location during evaluation: PACU Anesthesia Type: General Level of consciousness: awake and alert Pain management: pain level controlled Vital Signs Assessment: post-procedure vital signs reviewed and stable Respiratory status: spontaneous breathing, nonlabored ventilation, respiratory function stable and patient connected to nasal cannula oxygen Cardiovascular status: blood pressure returned to baseline and stable Postop Assessment: no signs of nausea or vomiting Anesthetic complications: no     Last Vitals:  Vitals:   02/27/16 0518 02/27/16 0718  BP: (!) 117/52 103/61  Pulse: 68 71  Resp: 18 18  Temp: 36.7 C 36.6 C    Last Pain:  Vitals:   02/27/16 0950  TempSrc:   PainSc: 2                  Yevette EdwardsJames G Geselle Cardosa

## 2016-03-14 DIAGNOSIS — N8000 Endometriosis of the uterus, unspecified: Secondary | ICD-10-CM | POA: Insufficient documentation

## 2016-03-31 DIAGNOSIS — N803 Endometriosis of pelvic peritoneum: Secondary | ICD-10-CM | POA: Diagnosis not present

## 2016-03-31 DIAGNOSIS — R1032 Left lower quadrant pain: Secondary | ICD-10-CM | POA: Diagnosis not present

## 2016-03-31 DIAGNOSIS — R102 Pelvic and perineal pain: Secondary | ICD-10-CM | POA: Diagnosis not present

## 2016-04-22 DIAGNOSIS — Z1211 Encounter for screening for malignant neoplasm of colon: Secondary | ICD-10-CM | POA: Diagnosis not present

## 2016-04-22 DIAGNOSIS — Z8371 Family history of colonic polyps: Secondary | ICD-10-CM | POA: Diagnosis not present

## 2016-05-01 LAB — LIPID PANEL
Cholesterol: 210 mg/dL — AB (ref 0–200)
HDL: 65 mg/dL (ref 35–70)
LDL CALC: 122 mg/dL
Triglycerides: 115 mg/dL (ref 40–160)

## 2016-05-16 DIAGNOSIS — R102 Pelvic and perineal pain: Secondary | ICD-10-CM | POA: Diagnosis not present

## 2016-05-20 DIAGNOSIS — R102 Pelvic and perineal pain: Secondary | ICD-10-CM | POA: Diagnosis not present

## 2016-06-10 DIAGNOSIS — D126 Benign neoplasm of colon, unspecified: Secondary | ICD-10-CM | POA: Diagnosis not present

## 2016-06-10 DIAGNOSIS — Z1211 Encounter for screening for malignant neoplasm of colon: Secondary | ICD-10-CM | POA: Diagnosis not present

## 2016-06-10 DIAGNOSIS — K648 Other hemorrhoids: Secondary | ICD-10-CM | POA: Diagnosis not present

## 2016-06-10 DIAGNOSIS — K640 First degree hemorrhoids: Secondary | ICD-10-CM | POA: Diagnosis not present

## 2016-06-10 DIAGNOSIS — D125 Benign neoplasm of sigmoid colon: Secondary | ICD-10-CM | POA: Diagnosis not present

## 2016-06-10 DIAGNOSIS — Z8371 Family history of colonic polyps: Secondary | ICD-10-CM | POA: Diagnosis not present

## 2016-06-10 DIAGNOSIS — K635 Polyp of colon: Secondary | ICD-10-CM | POA: Diagnosis not present

## 2016-06-10 LAB — HM COLONOSCOPY

## 2016-07-07 ENCOUNTER — Encounter: Payer: BLUE CROSS/BLUE SHIELD | Admitting: Family Medicine

## 2016-07-07 DIAGNOSIS — R140 Abdominal distension (gaseous): Secondary | ICD-10-CM | POA: Diagnosis not present

## 2016-07-07 DIAGNOSIS — Z91018 Allergy to other foods: Secondary | ICD-10-CM | POA: Insufficient documentation

## 2016-07-07 DIAGNOSIS — K5900 Constipation, unspecified: Secondary | ICD-10-CM | POA: Diagnosis not present

## 2016-07-11 ENCOUNTER — Encounter: Payer: Self-pay | Admitting: Family Medicine

## 2016-07-11 ENCOUNTER — Ambulatory Visit (INDEPENDENT_AMBULATORY_CARE_PROVIDER_SITE_OTHER): Payer: BLUE CROSS/BLUE SHIELD | Admitting: Family Medicine

## 2016-07-11 VITALS — BP 123/77 | HR 77 | Temp 98.5°F | Ht 64.0 in | Wt 185.4 lb

## 2016-07-11 DIAGNOSIS — Z1231 Encounter for screening mammogram for malignant neoplasm of breast: Secondary | ICD-10-CM

## 2016-07-11 DIAGNOSIS — Z0000 Encounter for general adult medical examination without abnormal findings: Secondary | ICD-10-CM

## 2016-07-11 DIAGNOSIS — Z1239 Encounter for other screening for malignant neoplasm of breast: Secondary | ICD-10-CM

## 2016-07-11 LAB — UA/M W/RFLX CULTURE, ROUTINE
Bilirubin, UA: NEGATIVE
Glucose, UA: NEGATIVE
Ketones, UA: NEGATIVE
Leukocytes, UA: NEGATIVE
Nitrite, UA: NEGATIVE
PH UA: 6 (ref 5.0–7.5)
PROTEIN UA: NEGATIVE
RBC, UA: NEGATIVE
Specific Gravity, UA: <1.005 — ABNORMAL LOW (ref 1.005–1.030)
UUROB: 0.2 mg/dL (ref 0.2–1.0)

## 2016-07-11 LAB — MICROSCOPIC EXAMINATION

## 2016-07-11 NOTE — Progress Notes (Signed)
BP 123/77 (BP Location: Left Arm, Patient Position: Sitting, Cuff Size: Large)   Pulse 77   Temp 98.5 F (36.9 C)   Ht 5\' 4"  (1.626 m)   Wt 185 lb 6.4 oz (84.1 kg)   LMP 01/21/2016 (Exact Date)   SpO2 98%   BMI 31.82 kg/m    Subjective:    Patient ID: Kathy Reeves, female    DOB: Sep 10, 1965, 51 y.o.   MRN: [id number]  HPI: Kathy Reeves is a 51 y.o. female presenting on 07/11/2016 for comprehensive medical examination. Current medical complaints include:none  She currently lives with: husband and kids Menopausal Symptoms: no  Depression Screen done today and results listed below:  Depression screen [place] 2/9 07/11/2016 07/02/2015  Decreased Interest 0 0  Down, Depressed, Hopeless 0 1  PHQ - 2 Score 0 1  Altered sleeping 0 -  Tired, decreased energy 0 -  Change in appetite 0 -  Feeling bad or failure about yourself  0 -  Trouble concentrating 0 -  Moving slowly or fidgety/restless 0 -  Suicidal thoughts 0 -  PHQ-9 Score 0 -    Past Medical History:  Past Medical History:  Diagnosis Date  . Allergic rhinitis   . Anxiety   . Endometriosis   . GERD (gastroesophageal reflux disease)   . Headache     Surgical History:  Past Surgical History:  Procedure Laterality Date  . ABDOMINAL HYSTERECTOMY    . CESAREAN SECTION    . LAPAROSCOPIC VAGINAL HYSTERECTOMY WITH SALPINGECTOMY Bilateral 02/26/2016   Procedure: LAPAROSCOPIC ASSISTED VAGINAL HYSTERECTOMY WITH SALPINGECTOMY;  Surgeon: Suzy Bouchardhomas J Schermerhorn, MD;  Location: [facility] ORS;  Service: Gynecology;  Laterality: Bilateral;  . uterus ablation      Medications:  Current Outpatient Prescriptions on File Prior to Visit  Medication Sig  . cetirizine (ZYRTEC ALLERGY) 10 MG tablet Take 10 mg by mouth daily as needed for allergies.  Marland Kitchen. docusate sodium (COLACE) 100 MG capsule Take 1 capsule (100 mg total) by mouth 2 (two) times daily.  Marland Kitchen. ibuprofen (MOTRIN IB) 200 MG tablet Take 4 tablets (800 mg total) by mouth every 8  (eight) hours as needed.  . Multiple Vitamin (MULTIVITAMIN WITH MINERALS) TABS tablet Take 1 tablet by mouth daily as needed (for dietary supplement). One-A-Day  . NON FORMULARY Apply 1 application topically 4 (four) times daily as needed (for muscle aches/pain). Essential Oils:Marjoram/Lemongrass/Peppermint use as needed for muscles aches & pains   No current facility-administered medications on file prior to visit.     Allergies:  Allergies  Allergen Reactions  . Latex Rash    Social History:  Social History   Social History  . Marital status: Married    Spouse name: N/A  . Number of children: N/A  . Years of education: N/A   Occupational History  . Not on file.   Social History Main Topics  . Smoking status: Never Smoker  . Smokeless tobacco: Never Used  . Alcohol use No  . Drug use: No  . Sexual activity: Yes    Birth control/ protection: Surgical   Other Topics Concern  . Not on file   Social History Narrative  . No narrative on file   History  Smoking Status  . Never Smoker  Smokeless Tobacco  . Never Used   History  Alcohol Use No    Family History:  Family History  Problem Relation Age of Onset  . CAD Mother   . Heart disease Mother   .  Fibromyalgia Mother   . Arthritis Mother   . Cystic kidney disease Mother   . Diabetes Father     Past medical history, surgical history, medications, allergies, family history and social history reviewed with patient today and changes made to appropriate areas of the chart.   Review of Systems  Constitutional: Negative.   HENT: Negative.   Eyes: Negative.   Respiratory: Negative.   Cardiovascular: Negative.   Gastrointestinal: Positive for constipation. Negative for abdominal pain, blood in stool, diarrhea, heartburn, melena, nausea and vomiting.  Genitourinary: Negative.   Musculoskeletal: Negative.   Skin: Negative.   Neurological: Positive for headaches. Negative for dizziness, tingling, tremors,  sensory change, speech change, focal weakness, seizures and loss of consciousness.  Endo/Heme/Allergies: Positive for environmental allergies. Negative for polydipsia. Does not bruise/bleed easily.  Psychiatric/Behavioral: Negative.     All other ROS negative except what is listed above and in the HPI.      Objective:    BP 123/77 (BP Location: Left Arm, Patient Position: Sitting, Cuff Size: Large)   Pulse 77   Temp 98.5 F (36.9 C)   Ht 5\' 4"  (1.626 m)   Wt 185 lb 6.4 oz (84.1 kg)   LMP 01/21/2016 (Exact Date)   SpO2 98%   BMI 31.82 kg/m   Wt Readings from Last 3 Encounters:  07/11/16 185 lb 6.4 oz (84.1 kg)  02/26/16 194 lb (88 kg)  02/19/16 194 lb (88 kg)    Physical Exam  Constitutional: She is oriented to person, place, and time. She appears well-developed and well-nourished. No distress.  HENT:  Head: Normocephalic and atraumatic.  Right Ear: Hearing, tympanic membrane, external ear and ear canal normal.  Left Ear: Hearing, tympanic membrane, external ear and ear canal normal.  Nose: Nose normal.  Mouth/Throat: Uvula is midline, oropharynx is clear and moist and mucous membranes are normal. No oropharyngeal exudate.  Eyes: Conjunctivae, EOM and lids are normal. Pupils are equal, round, and reactive to light. Right eye exhibits no discharge. Left eye exhibits no discharge. No scleral icterus.  Neck: Normal range of motion. Neck supple. No JVD present. No tracheal deviation present. No thyromegaly present.  Cardiovascular: Normal rate, regular rhythm, normal heart sounds and intact distal pulses.  Exam reveals no gallop and no friction rub.   No murmur heard. Pulmonary/Chest: Effort normal and breath sounds normal. No stridor. No respiratory distress. She has no wheezes. She has no rales. She exhibits no tenderness. Right breast exhibits no inverted nipple, no mass, no nipple discharge, no skin change and no tenderness. Left breast exhibits no inverted nipple, no mass, no  nipple discharge, no skin change and no tenderness. Breasts are symmetrical.  Abdominal: Soft. Bowel sounds are normal. She exhibits no distension and no mass. There is no tenderness. There is no rebound and no guarding.  Genitourinary:  Genitourinary Comments: Deferred with shared decision making.   Musculoskeletal: Normal range of motion. She exhibits no edema, tenderness or deformity.  Lymphadenopathy:    She has no cervical adenopathy.  Neurological: She is alert and oriented to person, place, and time. She has normal reflexes. She displays normal reflexes. No cranial nerve deficit. She exhibits normal muscle tone. Coordination normal.  Skin: Skin is warm, dry and intact. No rash noted. She is not diaphoretic. No erythema. No pallor.  Psychiatric: She has a normal mood and affect. Her speech is normal and behavior is normal. Judgment and thought content normal. Cognition and memory are normal.  Nursing note and  vitals reviewed.   Results for orders placed or performed in visit on 07/11/16  Lipid panel  Result Value Ref Range   Triglycerides 115 40 - 160 mg/dL   Cholesterol 161 (A) 0 - 200 mg/dL   HDL 65 35 - 70 mg/dL   LDL Cholesterol 096 mg/dL  HM COLONOSCOPY  Result Value Ref Range   HM Colonoscopy Patient Reported See Report (in chart), Patient Reported      Assessment & Plan:   Problem List Items Addressed This Visit    None    Visit Diagnoses    Routine general medical examination at a health care facility    -  Primary   Up to date on vaccines. Screening labs checked today. Pap N/A. Mammogram ordered today. Colonoscopy up to date. Call with any concerns.    Relevant Orders   CBC with Differential/Platelet   Comprehensive metabolic panel   TSH   UA/M w/rflx Culture, Routine   Screening for breast cancer       Mammogram ordered today. Await results.    Relevant Orders   MM DIGITAL SCREENING BILATERAL       Follow up plan: Return in about 1 year (around 07/11/2017)  for Physical.   LABORATORY TESTING:  - Pap smear: N/A  IMMUNIZATIONS:   - Tdap: Tetanus vaccination status reviewed: last tetanus booster within 10 years. - Influenza: Postponed to flu season - Pneumovax: Not applicable  SCREENING: -Mammogram: Ordered today  - Colonoscopy: Up to date   PATIENT COUNSELING:   Advised to take 1 mg of folate supplement per day if capable of pregnancy.   Sexuality: Discussed sexually transmitted diseases, partner selection, use of condoms, avoidance of unintended pregnancy  and contraceptive alternatives.   Advised to avoid cigarette smoking.  I discussed with the patient that most people either abstain from alcohol or drink within safe limits (<=14/week and <=4 drinks/occasion for males, <=7/weeks and <= 3 drinks/occasion for females) and that the risk for alcohol disorders and other health effects rises proportionally with the number of drinks per week and how often a drinker exceeds daily limits.  Discussed cessation/primary prevention of drug use and availability of treatment for abuse.   Diet: Encouraged to adjust caloric intake to maintain  or achieve ideal body weight, to reduce intake of dietary saturated fat and total fat, to limit sodium intake by avoiding high sodium foods and not adding table salt, and to maintain adequate dietary potassium and calcium preferably from fresh fruits, vegetables, and low-fat dairy products.    stressed the importance of regular exercise  Injury prevention: Discussed safety belts, safety helmets, smoke detector, smoking near bedding or upholstery.   Dental health: Discussed importance of regular tooth brushing, flossing, and dental visits.    NEXT PREVENTATIVE PHYSICAL DUE IN 1 YEAR. Return in about 1 year (around 07/11/2017) for Physical.

## 2016-07-11 NOTE — Patient Instructions (Addendum)
Lactobacilis Acidophilus  Health Maintenance, Female Adopting a healthy lifestyle and getting preventive care can go a long way to promote health and wellness. Talk with your health care provider about what schedule of regular examinations is right for you. This is a good chance for you to check in with your provider about disease prevention and staying healthy. In between checkups, there are plenty of things you can do on your own. Experts have done a lot of research about which lifestyle changes and preventive measures are most likely to keep you healthy. Ask your health care provider for more information. Weight and diet Eat a healthy diet  Be sure to include plenty of vegetables, fruits, low-fat dairy products, and lean protein.  Do not eat a lot of foods high in solid fats, added sugars, or salt.  Get regular exercise. This is one of the most important things you can do for your health.  Most adults should exercise for at least 150 minutes each week. The exercise should increase your heart rate and make you sweat (moderate-intensity exercise).  Most adults should also do strengthening exercises at least twice a week. This is in addition to the moderate-intensity exercise. Maintain a healthy weight  Body mass index (BMI) is a measurement that can be used to identify possible weight problems. It estimates body fat based on height and weight. Your health care provider can help determine your BMI and help you achieve or maintain a healthy weight.  For females 1 years of age and older:  A BMI below 18.5 is considered underweight.  A BMI of 18.5 to 24.9 is normal.  A BMI of 25 to 29.9 is considered overweight.  A BMI of 30 and above is considered obese. Watch levels of cholesterol and blood lipids  You should start having your blood tested for lipids and cholesterol at 52 years of age, then have this test every 5 years.  You may need to have your cholesterol levels checked more  often if:  Your lipid or cholesterol levels are high.  You are older than 52 years of age.  You are at high risk for heart disease. Cancer screening Lung Cancer  Lung cancer screening is recommended for adults 60-41 years old who are at high risk for lung cancer because of a history of smoking.  A yearly low-dose CT scan of the lungs is recommended for people who:  Currently smoke.  Have quit within the past 15 years.  Have at least a 30-pack-year history of smoking. A pack year is smoking an average of one pack of cigarettes a day for 1 year.  Yearly screening should continue until it has been 15 years since you quit.  Yearly screening should stop if you develop a health problem that would prevent you from having lung cancer treatment. Breast Cancer  Practice breast self-awareness. This means understanding how your breasts normally appear and feel.  It also means doing regular breast self-exams. Let your health care provider know about any changes, no matter how small.  If you are in your 20s or 30s, you should have a clinical breast exam (CBE) by a health care provider every 1-3 years as part of a regular health exam.  If you are 29 or older, have a CBE every year. Also consider having a breast X-ray (mammogram) every year.  If you have a family history of breast cancer, talk to your health care provider about genetic screening.  If you are at high risk for  breast cancer, talk to your health care provider about having an MRI and a mammogram every year.  Breast cancer gene (BRCA) assessment is recommended for women who have family members with BRCA-related cancers. BRCA-related cancers include:  Breast.  Ovarian.  Tubal.  Peritoneal cancers.  Results of the assessment will determine the need for genetic counseling and BRCA1 and BRCA2 testing. Cervical Cancer  Your health care provider may recommend that you be screened regularly for cancer of the pelvic organs  (ovaries, uterus, and vagina). This screening involves a pelvic examination, including checking for microscopic changes to the surface of your cervix (Pap test). You may be encouraged to have this screening done every 3 years, beginning at age 67.  For women ages 7-65, health care providers may recommend pelvic exams and Pap testing every 3 years, or they may recommend the Pap and pelvic exam, combined with testing for human papilloma virus (HPV), every 5 years. Some types of HPV increase your risk of cervical cancer. Testing for HPV may also be done on women of any age with unclear Pap test results.  Other health care providers may not recommend any screening for nonpregnant women who are considered low risk for pelvic cancer and who do not have symptoms. Ask your health care provider if a screening pelvic exam is right for you.  If you have had past treatment for cervical cancer or a condition that could lead to cancer, you need Pap tests and screening for cancer for at least 20 years after your treatment. If Pap tests have been discontinued, your risk factors (such as having a new sexual partner) need to be reassessed to determine if screening should resume. Some women have medical problems that increase the chance of getting cervical cancer. In these cases, your health care provider may recommend more frequent screening and Pap tests. Colorectal Cancer  This type of cancer can be detected and often prevented.  Routine colorectal cancer screening usually begins at 52 years of age and continues through 52 years of age.  Your health care provider may recommend screening at an earlier age if you have risk factors for colon cancer.  Your health care provider may also recommend using home test kits to check for hidden blood in the stool.  A small camera at the end of a tube can be used to examine your colon directly (sigmoidoscopy or colonoscopy). This is done to check for the earliest forms of  colorectal cancer.  Routine screening usually begins at age 90.  Direct examination of the colon should be repeated every 5-10 years through 52 years of age. However, you may need to be screened more often if early forms of precancerous polyps or small growths are found. Skin Cancer  Check your skin from head to toe regularly.  Tell your health care provider about any new moles or changes in moles, especially if there is a change in a mole's shape or color.  Also tell your health care provider if you have a mole that is larger than the size of a pencil eraser.  Always use sunscreen. Apply sunscreen liberally and repeatedly throughout the day.  Protect yourself by wearing long sleeves, pants, a wide-brimmed hat, and sunglasses whenever you are outside. Heart disease, diabetes, and high blood pressure  High blood pressure causes heart disease and increases the risk of stroke. High blood pressure is more likely to develop in:  People who have blood pressure in the high end of the normal range (130-139/85-89  mm Hg).  People who are overweight or obese.  People who are African American.  If you are 72-93 years of age, have your blood pressure checked every 3-5 years. If you are 58 years of age or older, have your blood pressure checked every year. You should have your blood pressure measured twice-once when you are at a hospital or clinic, and once when you are not at a hospital or clinic. Record the average of the two measurements. To check your blood pressure when you are not at a hospital or clinic, you can use:  An automated blood pressure machine at a pharmacy.  A home blood pressure monitor.  If you are between 96 years and 21 years old, ask your health care provider if you should take aspirin to prevent strokes.  Have regular diabetes screenings. This involves taking a blood sample to check your fasting blood sugar level.  If you are at a normal weight and have a low risk for  diabetes, have this test once every three years after 52 years of age.  If you are overweight and have a high risk for diabetes, consider being tested at a younger age or more often. Preventing infection Hepatitis B  If you have a higher risk for hepatitis B, you should be screened for this virus. You are considered at high risk for hepatitis B if:  You were born in a country where hepatitis B is common. Ask your health care provider which countries are considered high risk.  Your parents were born in a high-risk country, and you have not been immunized against hepatitis B (hepatitis B vaccine).  You have HIV or AIDS.  You use needles to inject street drugs.  You live with someone who has hepatitis B.  You have had sex with someone who has hepatitis B.  You get hemodialysis treatment.  You take certain medicines for conditions, including cancer, organ transplantation, and autoimmune conditions. Hepatitis C  Blood testing is recommended for:  Everyone born from [birth year] through 1966.  Anyone with known risk factors for hepatitis C. Sexually transmitted infections (STIs)  You should be screened for sexually transmitted infections (STIs) including gonorrhea and chlamydia if:  You are sexually active and are younger than 52 years of age.  You are older than 52 years of age and your health care provider tells you that you are at risk for this type of infection.  Your sexual activity has changed since you were last screened and you are at an increased risk for chlamydia or gonorrhea. Ask your health care provider if you are at risk.  If you do not have HIV, but are at risk, it may be recommended that you take a prescription medicine daily to prevent HIV infection. This is called pre-exposure prophylaxis (PrEP). You are considered at risk if:  You are sexually active and do not regularly use condoms or know the HIV status of your partner(s).  You take drugs by injection.  You are  sexually active with a partner who has HIV. Talk with your health care provider about whether you are at high risk of being infected with HIV. If you choose to begin PrEP, you should first be tested for HIV. You should then be tested every 3 months for as long as you are taking PrEP. Pregnancy  If you are premenopausal and you may become pregnant, ask your health care provider about preconception counseling.  If you may become pregnant, take 400 to 800 micrograms (mcg)  of folic acid every day.  If you want to prevent pregnancy, talk to your health care provider about birth control (contraception). Osteoporosis and menopause  Osteoporosis is a disease in which the bones lose minerals and strength with aging. This can result in serious bone fractures. Your risk for osteoporosis can be identified using a bone density scan.  If you are 99 years of age or older, or if you are at risk for osteoporosis and fractures, ask your health care provider if you should be screened.  Ask your health care provider whether you should take a calcium or vitamin D supplement to lower your risk for osteoporosis.  Menopause may have certain physical symptoms and risks.  Hormone replacement therapy may reduce some of these symptoms and risks. Talk to your health care provider about whether hormone replacement therapy is right for you. Follow these instructions at home:  Schedule regular health, dental, and eye exams.  Stay current with your immunizations.  Do not use any tobacco products including cigarettes, chewing tobacco, or electronic cigarettes.  If you are pregnant, do not drink alcohol.  If you are breastfeeding, limit how much and how often you drink alcohol.  Limit alcohol intake to no more than 1 drink per day for nonpregnant women. One drink equals 12 ounces of beer, 5 ounces of wine, or 1 ounces of hard liquor.  Do not use street drugs.  Do not share needles.  Ask your health care  provider for help if you need support or information about quitting drugs.  Tell your health care provider if you often feel depressed.  Tell your health care provider if you have ever been abused or do not feel safe at home. This information is not intended to replace advice given to you by your health care provider. Make sure you discuss any questions you have with your health care provider. Document Released: 09/02/2010 Document Revised: 07/26/2015 Document Reviewed: 11/21/2014 Elsevier Interactive Patient Education  2017 [employer].

## 2016-07-12 LAB — COMPREHENSIVE METABOLIC PANEL
A/G RATIO: 1.8 (ref 1.2–2.2)
ALBUMIN: 4.6 g/dL (ref 3.5–5.5)
ALT: 23 IU/L (ref 0–32)
AST: 23 IU/L (ref 0–40)
Alkaline Phosphatase: 57 IU/L (ref 39–117)
BUN / CREAT RATIO: 13 (ref 9–23)
BUN: 10 mg/dL (ref 6–24)
Bilirubin Total: 0.4 mg/dL (ref 0.0–1.2)
CALCIUM: 10.1 mg/dL (ref 8.7–10.2)
CO2: 27 mmol/L (ref 18–29)
Chloride: 101 mmol/L (ref 96–106)
Creatinine, Ser: 0.79 mg/dL (ref 0.57–1.00)
GFR calc non Af Amer: 87 mL/min/{1.73_m2} (ref >59)
GFR, EST AFRICAN AMERICAN: 100 mL/min/{1.73_m2} (ref >59)
GLOBULIN, TOTAL: 2.6 g/dL (ref 1.5–4.5)
Glucose: 87 mg/dL (ref 65–99)
POTASSIUM: 3.9 mmol/L (ref 3.5–5.2)
SODIUM: 143 mmol/L (ref 134–144)
TOTAL PROTEIN: 7.2 g/dL (ref 6.0–8.5)

## 2016-07-12 LAB — TSH: TSH: 1.92 u[IU]/mL (ref 0.450–4.500)

## 2016-07-12 LAB — CBC WITH DIFFERENTIAL/PLATELET
Basophils Absolute: 0 10*3/uL (ref 0.0–0.2)
Basos: 0 %
EOS (ABSOLUTE): 0.1 10*3/uL (ref 0.0–0.4)
EOS: 2 %
HEMOGLOBIN: 12.9 g/dL (ref 11.1–15.9)
Hematocrit: 39.5 % (ref 34.0–46.6)
IMMATURE GRANS (ABS): 0 10*3/uL (ref 0.0–0.1)
Immature Granulocytes: 0 %
LYMPHS: 24 %
Lymphocytes Absolute: 1.3 10*3/uL (ref 0.7–3.1)
MCH: 28.4 pg (ref 26.6–33.0)
MCHC: 32.7 g/dL (ref 31.5–35.7)
MCV: 87 fL (ref 79–97)
MONOCYTES: 8 %
Monocytes Absolute: 0.4 10*3/uL (ref 0.1–0.9)
NEUTROS ABS: 3.7 10*3/uL (ref 1.4–7.0)
Neutrophils: 66 %
Platelets: 294 10*3/uL (ref 150–379)
RBC: 4.54 x10E6/uL (ref 3.77–5.28)
RDW: 14.7 % (ref 12.3–15.4)
WBC: 5.6 10*3/uL (ref 3.4–10.8)

## 2016-07-14 ENCOUNTER — Encounter: Payer: Self-pay | Admitting: Family Medicine

## 2017-07-14 ENCOUNTER — Encounter: Payer: BLUE CROSS/BLUE SHIELD | Admitting: Family Medicine

## 2017-09-23 ENCOUNTER — Ambulatory Visit
Admission: RE | Admit: 2017-09-23 | Discharge: 2017-09-23 | Disposition: A | Discharge status: Home / Self Care | Payer: BLUE CROSS/BLUE SHIELD | Source: Ambulatory Visit | Attending: Family Medicine | Admitting: Family Medicine

## 2017-09-23 ENCOUNTER — Other Ambulatory Visit: Payer: Self-pay

## 2017-09-23 ENCOUNTER — Encounter: Payer: Self-pay | Admitting: Family Medicine

## 2017-09-23 ENCOUNTER — Ambulatory Visit: Payer: BLUE CROSS/BLUE SHIELD | Admitting: Family Medicine

## 2017-09-23 VITALS — BP 116/76 | HR 86 | Temp 98.6°F | Ht 64.0 in | Wt 187.0 lb

## 2017-09-23 DIAGNOSIS — M5441 Lumbago with sciatica, right side: Secondary | ICD-10-CM | POA: Diagnosis not present

## 2017-09-23 DIAGNOSIS — Z1231 Encounter for screening mammogram for malignant neoplasm of breast: Secondary | ICD-10-CM

## 2017-09-23 DIAGNOSIS — M545 Low back pain: Secondary | ICD-10-CM | POA: Diagnosis not present

## 2017-09-23 DIAGNOSIS — M8938 Hypertrophy of bone, other site: Secondary | ICD-10-CM | POA: Insufficient documentation

## 2017-09-23 DIAGNOSIS — D649 Anemia, unspecified: Secondary | ICD-10-CM | POA: Insufficient documentation

## 2017-09-23 DIAGNOSIS — R102 Pelvic and perineal pain: Secondary | ICD-10-CM | POA: Insufficient documentation

## 2017-09-23 DIAGNOSIS — Z1239 Encounter for other screening for malignant neoplasm of breast: Secondary | ICD-10-CM

## 2017-09-23 HISTORY — DX: Pelvic and perineal pain: R10.2

## 2017-09-23 MED ORDER — CYCLOBENZAPRINE HCL 10 MG PO TABS: 10.0000 mg | ORAL_TABLET | Freq: Three times a day (TID) | ORAL | 0 refills | Status: AC | PRN

## 2017-09-23 MED ORDER — TRIAMCINOLONE ACETONIDE 40 MG/ML IJ SUSP
40.0000 mg | Freq: Once | INTRAMUSCULAR | Status: AC
  Administered 2017-09-23: 40 mg via INTRAMUSCULAR

## 2017-09-23 NOTE — Patient Instructions (Addendum)
[facility] [place] [facility] [address], [place], [place] [id number]   [facility] [phone]

## 2017-09-23 NOTE — Progress Notes (Signed)
BP 116/76   Pulse 86   Temp 98.6 F (37 C) (Oral)   Ht 5\' 4"  (1.626 m)   Wt 187 lb (84.8 kg)   LMP 01/21/2016 (Exact Date)   SpO2 98%   BMI 32.10 kg/m    Subjective:    Patient ID: Kathy Reeves Companion, female    DOB: 16-Oct-1965, 52 y.o.   MRN: [id number]  HPI: Kathy Reeves is a 52 y.o. female  Chief Complaint  Patient presents with  . Back Pain    ongoing for about 6 days   Low back pain going down right leg x 1 week. Some tingling and numbness down buttock and posterior leg. Had a bad fall 6 years ago and has since had flare ups. Usually able to rest, massage, apply lemongrass oil but this time has found no relief. Sitting seems to be worst because she becomes stiff. Trying ibuprofen, massage, muscle rubs, heat/ice, tens unit, stretches. Has had luck with PT in the past for this.  Relevant past medical, surgical, family and social history reviewed and updated as indicated. Interim medical history since our last visit reviewed. Allergies and medications reviewed and updated.  Review of Systems  Per HPI unless specifically indicated above     Objective:    BP 116/76   Pulse 86   Temp 98.6 F (37 C) (Oral)   Ht 5\' 4"  (1.626 m)   Wt 187 lb (84.8 kg)   LMP 01/21/2016 (Exact Date)   SpO2 98%   BMI 32.10 kg/m   Wt Readings from Last 3 Encounters:  09/23/17 187 lb (84.8 kg)  07/11/16 185 lb 6.4 oz (84.1 kg)  02/26/16 194 lb (88 kg)    Physical Exam  Constitutional: She is oriented to person, place, and time. She appears well-developed and well-nourished. No distress.  HENT:  Head: Atraumatic.  Eyes: Conjunctivae and EOM are normal.  Neck: Neck supple.  Cardiovascular: Normal rate, regular rhythm, normal heart sounds and intact distal pulses.  Pulmonary/Chest: Effort normal and breath sounds normal.  Musculoskeletal:  Antalgic gait Pain with right SLR but no reproduction of numbness  Neurological: She is alert and oriented to person, place, and time. No sensory  deficit.  Skin: Skin is warm and dry.  Psychiatric: She has a normal mood and affect. Her behavior is normal.  Nursing note and vitals reviewed.   Results for orders placed or performed in visit on 07/11/16  Microscopic Examination  Result Value Ref Range   WBC, UA 0-5 0 - 5 /hpf   RBC, UA 0-2 0 - 2 /hpf   Epithelial Cells (non renal) 0-10 0 - 10 /hpf   Bacteria, UA Few None seen/Few  CBC with Differential/Platelet  Result Value Ref Range   WBC 5.6 3.4 - 10.8 x10E3/uL   RBC 4.54 3.77 - 5.28 x10E6/uL   Hemoglobin 12.9 11.1 - 15.9 g/dL   Hematocrit 40.9 81.1 - 46.6 %   MCV 87 79 - 97 fL   MCH 28.4 26.6 - 33.0 pg   MCHC 32.7 31.5 - 35.7 g/dL   RDW 91.4 78.2 - 95.6 %   Platelets 294 150 - 379 x10E3/uL   Neutrophils 66 Not Estab. %   Lymphs 24 Not Estab. %   Monocytes 8 Not Estab. %   Eos 2 Not Estab. %   Basos 0 Not Estab. %   Neutrophils Absolute 3.7 1.4 - 7.0 x10E3/uL   Lymphocytes Absolute 1.3 0.7 - 3.1 x10E3/uL   Monocytes  Absolute 0.4 0.1 - 0.9 x10E3/uL   EOS (ABSOLUTE) 0.1 0.0 - 0.4 x10E3/uL   Basophils Absolute 0.0 0.0 - 0.2 x10E3/uL   Immature Granulocytes 0 Not Estab. %   Immature Grans (Abs) 0.0 0.0 - 0.1 x10E3/uL  Comprehensive metabolic panel  Result Value Ref Range   Glucose 87 65 - 99 mg/dL   BUN 10 6 - 24 mg/dL   Creatinine, Ser 1.610.79 0.57 - 1.00 mg/dL   GFR calc non Af Amer 87 >59 mL/min/1.73   GFR calc Af Amer 100 >59 mL/min/1.73   BUN/Creatinine Ratio 13 9 - 23   Sodium 143 134 - 144 mmol/L   Potassium 3.9 3.5 - 5.2 mmol/L   Chloride 101 96 - 106 mmol/L   CO2 27 18 - 29 mmol/L   Calcium 10.1 8.7 - 10.2 mg/dL   Total Protein 7.2 6.0 - 8.5 g/dL   Albumin 4.6 3.5 - 5.5 g/dL   Globulin, Total 2.6 1.5 - 4.5 g/dL   Albumin/Globulin Ratio 1.8 1.2 - 2.2   Bilirubin Total 0.4 0.0 - 1.2 mg/dL   Alkaline Phosphatase 57 39 - 117 IU/L   AST 23 0 - 40 IU/L   ALT 23 0 - 32 IU/L  TSH  Result Value Ref Range   TSH 1.920 0.450 - 4.500 uIU/mL  UA/M w/rflx  Culture, Routine  Result Value Ref Range   Specific Gravity, UA <1.005 (L) 1.005 - 1.030   pH, UA 6.0 5.0 - 7.5   Color, UA Yellow Yellow   Appearance Ur Clear Clear   Leukocytes, UA Negative Negative   Protein, UA Negative Negative/Trace   Glucose, UA Negative Negative   Ketones, UA Negative Negative   RBC, UA Negative Negative   Bilirubin, UA Negative Negative   Urobilinogen, Ur 0.2 0.2 - 1.0 mg/dL   Nitrite, UA Negative Negative   Microscopic Examination See below:   Lipid panel  Result Value Ref Range   Triglycerides 115 40 - 160 mg/dL   Cholesterol 096210 (A) 0 - 200 mg/dL   HDL 65 35 - 70 mg/dL   LDL Cholesterol 045122 mg/dL  HM COLONOSCOPY  Result Value Ref Range   [facility] Colonoscopy Patient Reported See Report (in chart), Patient Reported      Assessment & Plan:   Problem List Items Addressed This Visit    None    Visit Diagnoses    Acute midline low back pain with right-sided sciatica    -  Primary   Given frequency of flares and worsening numbness, will get lumbar x-ray and restart PT. IM kenalog, flexeril, lidocaine patches, stretches   Relevant Medications   cyclobenzaprine (FLEXERIL) 10 MG tablet   triamcinolone acetonide (KENALOG-40) injection 40 mg (Completed)   Other Relevant Orders   DG Lumbar Spine Complete (Completed)   Ambulatory referral to Physical Therapy   Screening for breast cancer       Relevant Orders   MM Digital Screening       Follow up plan: Return in about 1 month (around 10/21/2017) for CPE with Dr. Laural BenesJohnson.

## 2017-09-24 ENCOUNTER — Telehealth: Payer: Self-pay | Admitting: Family Medicine

## 2017-09-24 NOTE — Telephone Encounter (Signed)
Spoke with pt regarding degeneration seen in L spine on x-ray, recommended f/u MRI given persistent and worsening sciatica flares. Pt will consider this and let us know. She is hoping to restart PT in the meantime

## 2017-10-07 ENCOUNTER — Telehealth: Payer: Self-pay | Admitting: Family Medicine

## 2017-10-07 DIAGNOSIS — M5441 Lumbago with sciatica, right side: Principal | ICD-10-CM

## 2017-10-07 DIAGNOSIS — G8929 Other chronic pain: Secondary | ICD-10-CM

## 2017-10-07 NOTE — Telephone Encounter (Signed)
Spoke with patient regarding her referral to PT - [facility] still saying they haven't received an order from us but did schedule her for treatment pending the order going through. Will resend referral and reach out with coordinator to make sure everything is settled

## 2017-10-07 NOTE — Telephone Encounter (Signed)
Tried contacting pt multiple times, will try again later  Copied from CRM #[id number]. Topic: Quick Communication - See Telephone Encounter >> Oct 02, 2017  1:57 PM Raquel SarnaHayes, Teresa G wrote: Pt wanting to speak with Roosvelt Maserachel Antwon Rochin directly.  Please call her mobile number [phone].

## 2017-10-13 DIAGNOSIS — M545 Low back pain: Secondary | ICD-10-CM | POA: Diagnosis not present

## 2017-10-23 DIAGNOSIS — M545 Low back pain: Secondary | ICD-10-CM | POA: Diagnosis not present

## 2017-10-26 DIAGNOSIS — M545 Low back pain: Secondary | ICD-10-CM | POA: Diagnosis not present

## 2017-11-06 DIAGNOSIS — M545 Low back pain: Secondary | ICD-10-CM | POA: Diagnosis not present

## 2017-11-10 DIAGNOSIS — M545 Low back pain: Secondary | ICD-10-CM | POA: Diagnosis not present

## 2017-11-20 ENCOUNTER — Encounter: Payer: Self-pay | Admitting: Family Medicine

## 2017-11-20 ENCOUNTER — Ambulatory Visit (INDEPENDENT_AMBULATORY_CARE_PROVIDER_SITE_OTHER): Payer: BLUE CROSS/BLUE SHIELD | Admitting: Family Medicine

## 2017-11-20 VITALS — BP 121/76 | HR 80 | Ht 63.58 in | Wt 185.0 lb

## 2017-11-20 DIAGNOSIS — M25519 Pain in unspecified shoulder: Secondary | ICD-10-CM | POA: Insufficient documentation

## 2017-11-20 DIAGNOSIS — D649 Anemia, unspecified: Secondary | ICD-10-CM

## 2017-11-20 DIAGNOSIS — Z0000 Encounter for general adult medical examination without abnormal findings: Secondary | ICD-10-CM | POA: Diagnosis not present

## 2017-11-20 DIAGNOSIS — M545 Low back pain, unspecified: Secondary | ICD-10-CM | POA: Insufficient documentation

## 2017-11-20 DIAGNOSIS — M25559 Pain in unspecified hip: Secondary | ICD-10-CM | POA: Insufficient documentation

## 2017-11-20 DIAGNOSIS — R609 Edema, unspecified: Secondary | ICD-10-CM

## 2017-11-20 DIAGNOSIS — K219 Gastro-esophageal reflux disease without esophagitis: Secondary | ICD-10-CM | POA: Diagnosis not present

## 2017-11-20 DIAGNOSIS — M2560 Stiffness of unspecified joint, not elsewhere classified: Secondary | ICD-10-CM | POA: Insufficient documentation

## 2017-11-20 DIAGNOSIS — M6281 Muscle weakness (generalized): Secondary | ICD-10-CM | POA: Insufficient documentation

## 2017-11-20 HISTORY — DX: Edema, unspecified: R60.9

## 2017-11-20 LAB — UA/M W/RFLX CULTURE, ROUTINE
BILIRUBIN UA: NEGATIVE
GLUCOSE, UA: NEGATIVE
KETONES UA: NEGATIVE
Leukocytes, UA: NEGATIVE
Nitrite, UA: NEGATIVE
Protein, UA: NEGATIVE
RBC, UA: NEGATIVE
Specific Gravity, UA: 1.015 (ref 1.005–1.030)
UUROB: 0.2 mg/dL (ref 0.2–1.0)
pH, UA: 8.5 — ABNORMAL HIGH (ref 5.0–7.5)

## 2017-11-20 NOTE — Assessment & Plan Note (Signed)
Rechecking levels today. Await results.  

## 2017-11-20 NOTE — Patient Instructions (Addendum)
[facility]  Address: [address] [place], [place], [place] [address]  Phone: [phone]  Health Maintenance, Female Adopting a healthy lifestyle and getting preventive care can go a long way to promote health and wellness. Talk with your health care provider about what schedule of regular examinations is right for you. This is a good chance for you to check in with your provider about disease prevention and staying healthy. In between checkups, there are plenty of things you can do on your own. Experts have done a lot of research about which lifestyle changes and preventive measures are most likely to keep you healthy. Ask your health care provider for more information. Weight and diet Eat a healthy diet  Be sure to include plenty of vegetables, fruits, low-fat dairy products, and lean protein.  Do not eat a lot of foods high in solid fats, added sugars, or salt.  Get regular exercise. This is one of the most important things you can do for your health. ? Most adults should exercise for at least 150 minutes each week. The exercise should increase your heart rate and make you sweat (moderate-intensity exercise). ? Most adults should also do strengthening exercises at least twice a week. This is in addition to the moderate-intensity exercise.  Maintain a healthy weight  Body mass index (BMI) is a measurement that can be used to identify possible weight problems. It estimates body fat based on height and weight. Your health care provider can help determine your BMI and help you achieve or maintain a healthy weight.  For females 75 years of age and older: ? A BMI below 18.5 is considered underweight. ? A BMI of 18.5 to 24.9 is normal. ? A BMI of 25 to 29.9 is considered overweight. ? A BMI of 30 and above is considered obese.  Watch levels of cholesterol and blood lipids  You should start having your blood tested for lipids and cholesterol at 53 years of  age, then have this test every 5 years.  You may need to have your cholesterol levels checked more often if: ? Your lipid or cholesterol levels are high. ? You are older than 53 years of age. ? You are at high risk for heart disease.  Cancer screening Lung Cancer  Lung cancer screening is recommended for adults 52-5 years old who are at high risk for lung cancer because of a history of smoking.  A yearly low-dose CT scan of the lungs is recommended for people who: ? Currently smoke. ? Have quit within the past 15 years. ? Have at least a 30-pack-year history of smoking. A pack year is smoking an average of one pack of cigarettes a day for 1 year.  Yearly screening should continue until it has been 15 years since you quit.  Yearly screening should stop if you develop a health problem that would prevent you from having lung cancer treatment.  Breast Cancer  Practice breast self-awareness. This means understanding how your breasts normally appear and feel.  It also means doing regular breast self-exams. Let your health care provider know about any changes, no matter how small.  If you are in your 20s or 30s, you should have a clinical breast exam (CBE) by a health care provider every 1-3 years as part of a regular health exam.  If you are 9 or older, have a CBE every year. Also consider having a breast X-ray (mammogram) every year.  If you have a  family history of breast cancer, talk to your health care provider about genetic screening.  If you are at high risk for breast cancer, talk to your health care provider about having an MRI and a mammogram every year.  Breast cancer gene (BRCA) assessment is recommended for women who have family members with BRCA-related cancers. BRCA-related cancers include: ? Breast. ? Ovarian. ? Tubal. ? Peritoneal cancers.  Results of the assessment will determine the need for genetic counseling and BRCA1 and BRCA2 testing.  Cervical  Cancer Your health care provider may recommend that you be screened regularly for cancer of the pelvic organs (ovaries, uterus, and vagina). This screening involves a pelvic examination, including checking for microscopic changes to the surface of your cervix (Pap test). You may be encouraged to have this screening done every 3 years, beginning at age 21.  For women ages 30-65, health care providers may recommend pelvic exams and Pap testing every 3 years, or they may recommend the Pap and pelvic exam, combined with testing for human papilloma virus (HPV), every 5 years. Some types of HPV increase your risk of cervical cancer. Testing for HPV may also be done on women of any age with unclear Pap test results.  Other health care providers may not recommend any screening for nonpregnant women who are considered low risk for pelvic cancer and who do not have symptoms. Ask your health care provider if a screening pelvic exam is right for you.  If you have had past treatment for cervical cancer or a condition that could lead to cancer, you need Pap tests and screening for cancer for at least 20 years after your treatment. If Pap tests have been discontinued, your risk factors (such as having a new sexual partner) need to be reassessed to determine if screening should resume. Some women have medical problems that increase the chance of getting cervical cancer. In these cases, your health care provider may recommend more frequent screening and Pap tests.  Colorectal Cancer  This type of cancer can be detected and often prevented.  Routine colorectal cancer screening usually begins at 53 years of age and continues through 53 years of age.  Your health care provider may recommend screening at an earlier age if you have risk factors for colon cancer.  Your health care provider may also recommend using home test kits to check for hidden blood in the stool.  A small camera at the end of a tube can be used to  examine your colon directly (sigmoidoscopy or colonoscopy). This is done to check for the earliest forms of colorectal cancer.  Routine screening usually begins at age 50.  Direct examination of the colon should be repeated every 5-10 years through 53 years of age. However, you may need to be screened more often if early forms of precancerous polyps or small growths are found.  Skin Cancer  Check your skin from head to toe regularly.  Tell your health care provider about any new moles or changes in moles, especially if there is a change in a mole's shape or color.  Also tell your health care provider if you have a mole that is larger than the size of a pencil eraser.  Always use sunscreen. Apply sunscreen liberally and repeatedly throughout the day.  Protect yourself by wearing long sleeves, pants, a wide-brimmed hat, and sunglasses whenever you are outside.  Heart disease, diabetes, and high blood pressure  High blood pressure causes heart disease and increases the risk of   stroke. High blood pressure is more likely to develop in: ? People who have blood pressure in the high end of the normal range (130-139/85-89 mm Hg). ? People who are overweight or obese. ? People who are African American.  If you are 18-39 years of age, have your blood pressure checked every 3-5 years. If you are 40 years of age or older, have your blood pressure checked every year. You should have your blood pressure measured twice-once when you are at a hospital or clinic, and once when you are not at a hospital or clinic. Record the average of the two measurements. To check your blood pressure when you are not at a hospital or clinic, you can use: ? An automated blood pressure machine at a pharmacy. ? A home blood pressure monitor.  If you are between 55 years and 79 years old, ask your health care provider if you should take aspirin to prevent strokes.  Have regular diabetes screenings. This involves taking a  blood sample to check your fasting blood sugar level. ? If you are at a normal weight and have a low risk for diabetes, have this test once every three years after 53 years of age. ? If you are overweight and have a high risk for diabetes, consider being tested at a younger age or more often. Preventing infection Hepatitis B  If you have a higher risk for hepatitis B, you should be screened for this virus. You are considered at high risk for hepatitis B if: ? You were born in a country where hepatitis B is common. Ask your health care provider which countries are considered high risk. ? Your parents were born in a high-risk country, and you have not been immunized against hepatitis B (hepatitis B vaccine). ? You have HIV or AIDS. ? You use needles to inject street drugs. ? You live with someone who has hepatitis B. ? You have had sex with someone who has hepatitis B. ? You get hemodialysis treatment. ? You take certain medicines for conditions, including cancer, organ transplantation, and autoimmune conditions.  Hepatitis C  Blood testing is recommended for: ? Everyone born from 1945 through 1965. ? Anyone with known risk factors for hepatitis C.  Sexually transmitted infections (STIs)  You should be screened for sexually transmitted infections (STIs) including gonorrhea and chlamydia if: ? You are sexually active and are younger than 53 years of age. ? You are older than 53 years of age and your health care provider tells you that you are at risk for this type of infection. ? Your sexual activity has changed since you were last screened and you are at an increased risk for chlamydia or gonorrhea. Ask your health care provider if you are at risk.  If you do not have HIV, but are at risk, it may be recommended that you take a prescription medicine daily to prevent HIV infection. This is called pre-exposure prophylaxis (PrEP). You are considered at risk if: ? You are sexually active and  do not regularly use condoms or know the HIV status of your partner(s). ? You take drugs by injection. ? You are sexually active with a partner who has HIV.  Talk with your health care provider about whether you are at high risk of being infected with HIV. If you choose to begin PrEP, you should first be tested for HIV. You should then be tested every 3 months for as long as you are taking PrEP. Pregnancy  If   you are premenopausal and you may become pregnant, ask your health care provider about preconception counseling.  If you may become pregnant, take 400 to 800 micrograms (mcg) of folic acid every day.  If you want to prevent pregnancy, talk to your health care provider about birth control (contraception). Osteoporosis and menopause  Osteoporosis is a disease in which the bones lose minerals and strength with aging. This can result in serious bone fractures. Your risk for osteoporosis can be identified using a bone density scan.  If you are 23 years of age or older, or if you are at risk for osteoporosis and fractures, ask your health care provider if you should be screened.  Ask your health care provider whether you should take a calcium or vitamin D supplement to lower your risk for osteoporosis.  Menopause may have certain physical symptoms and risks.  Hormone replacement therapy may reduce some of these symptoms and risks. Talk to your health care provider about whether hormone replacement therapy is right for you. Follow these instructions at home:  Schedule regular health, dental, and eye exams.  Stay current with your immunizations.  Do not use any tobacco products including cigarettes, chewing tobacco, or electronic cigarettes.  If you are pregnant, do not drink alcohol.  If you are breastfeeding, limit how much and how often you drink alcohol.  Limit alcohol intake to no more than 1 drink per day for nonpregnant women. One drink equals 12 ounces of beer, 5 ounces of  wine, or 1 ounces of hard liquor.  Do not use street drugs.  Do not share needles.  Ask your health care provider for help if you need support or information about quitting drugs.  Tell your health care provider if you often feel depressed.  Tell your health care provider if you have ever been abused or do not feel safe at home. This information is not intended to replace advice given to you by your health care provider. Make sure you discuss any questions you have with your health care provider. Document Released: 09/02/2010 Document Revised: 07/26/2015 Document Reviewed: 11/21/2014 Elsevier Interactive Patient Education  [employer].

## 2017-11-20 NOTE — Assessment & Plan Note (Signed)
Improving. Will check labs. Call with any concerns.

## 2017-11-20 NOTE — Progress Notes (Signed)
BP 121/76   Pulse 80   Ht 5' 3.58" (1.615 m)   Wt 168 lb (76.2 kg)   LMP 01/21/2016 (Exact Date)   SpO2 99%   BMI 29.22 kg/m    Subjective:    Patient ID: Kathy Reeves Companion, female    DOB: 10-Aug-1965, 52 y.o.   MRN: [id number]  HPI: Kathy Reeves is a 52 y.o. female presenting on 11/20/2017 for comprehensive medical examination. Current medical complaints include:none. Has been working on [employer] and exercising. Has lost almost 20lbs!  Menopausal Symptoms: no  Depression Screen done today and results listed below:  Depression screen [facility] 2/9 11/20/2017 09/23/2017 07/11/2016 07/11/2016 07/02/2015  Decreased Interest 0 0 0 0 0  Down, Depressed, Hopeless 0 0 0 0 1  PHQ - 2 Score 0 0 0 0 1  Altered sleeping 0 1 0 0 -  Tired, decreased energy 0 1 0 0 -  Change in appetite 0 0 0 0 -  Feeling bad or failure about yourself  0 0 0 0 -  Trouble concentrating 0 0 0 0 -  Moving slowly or fidgety/restless 0 0 0 0 -  Suicidal thoughts 0 0 0 0 -  PHQ-9 Score 0 2 0 0 -  Difficult doing work/chores Not difficult at all - - - -    Past Medical History:  Past Medical History:  Diagnosis Date  . Allergic rhinitis   . Anxiety   . Edema 11/20/2017  . Endometriosis   . Female pelvic pain 09/23/2017  . GERD (gastroesophageal reflux disease)   . Headache     Surgical History:  Past Surgical History:  Procedure Laterality Date  . ABDOMINAL HYSTERECTOMY    . CESAREAN SECTION    . LAPAROSCOPIC VAGINAL HYSTERECTOMY WITH SALPINGECTOMY Bilateral 02/26/2016   Procedure: LAPAROSCOPIC ASSISTED VAGINAL HYSTERECTOMY WITH SALPINGECTOMY;  Surgeon: Suzy Bouchard, MD;  Location: [facility] ORS;  Service: Gynecology;  Laterality: Bilateral;  . uterus ablation      Medications:  Current Outpatient Medications on File Prior to Visit  Medication Sig  . cetirizine (ZYRTEC ALLERGY) 10 MG tablet Take 10 mg by mouth daily as needed for allergies.  . cyclobenzaprine (FLEXERIL) 10 MG tablet Take 1 tablet  (10 mg total) by mouth 3 (three) times daily as needed for muscle spasms.  . diphenhydrAMINE (BENADRYL) 25 MG tablet Take 10 mg by mouth every 6 (six) hours as needed.  Marland Kitchen ibuprofen (MOTRIN IB) 200 MG tablet Take 4 tablets (800 mg total) by mouth every 8 (eight) hours as needed.  . NON FORMULARY Apply 1 application topically 4 (four) times daily as needed (for muscle aches/pain). Essential Oils:Marjoram/Lemongrass/Peppermint use as needed for muscles aches & pains  . UNABLE TO FIND Essiential Oil  . meloxicam (MOBIC) 15 MG tablet meloxicam 15 mg tablet  Take 1 tablet every day by oral route.   No current facility-administered medications on file prior to visit.     Allergies:  Allergies  Allergen Reactions  . Latex Rash    Social History:  Social History   Socioeconomic History  . Marital status: Married    Spouse name: Not on file  . Number of children: Not on file  . Years of education: Not on file  . Highest education level: Not on file  Occupational History  . Not on file  Social Needs  . Financial resource strain: Not on file  . Food insecurity:    Worry: Not on file  Inability: Not on file  . Transportation needs:    Medical: Not on file    Non-medical: Not on file  Tobacco Use  . Smoking status: Never Smoker  . Smokeless tobacco: Never Used  Substance and Sexual Activity  . Alcohol use: No  . Drug use: No  . Sexual activity: Yes    Birth control/protection: Surgical  Lifestyle  . Physical activity:    Days per week: Not on file    Minutes per session: Not on file  . Stress: Not on file  Relationships  . Social connections:    Talks on phone: Not on file    Gets together: Not on file    Attends religious service: Not on file    Active member of club or organization: Not on file    Attends meetings of clubs or organizations: Not on file    Relationship status: Not on file  . Intimate partner violence:    Fear of current or ex partner: Not on file     Emotionally abused: Not on file    Physically abused: Not on file    Forced sexual activity: Not on file  Other Topics Concern  . Not on file  Social History Narrative  . Not on file   Social History   Tobacco Use  Smoking Status Never Smoker  Smokeless Tobacco Never Used   Social History   Substance and Sexual Activity  Alcohol Use No    Family History:  Family History  Problem Relation Age of Onset  . CAD Mother   . Heart disease Mother   . Fibromyalgia Mother   . Arthritis Mother   . Cystic kidney disease Mother   . Diabetes Father   . Cancer Maternal Grandmother        Leukemia  . Cancer Maternal Aunt        Leukemia    Past medical history, surgical history, medications, allergies, family history and social history reviewed with patient today and changes made to appropriate areas of the chart.   Review of Systems  Constitutional: Negative.   HENT: Negative.   Eyes: Negative.   Respiratory: Negative.   Cardiovascular: Negative.   Gastrointestinal: Positive for abdominal pain (from endometriosis). Negative for blood in stool, constipation, diarrhea, heartburn, melena, nausea and vomiting.  Genitourinary: Negative.   Musculoskeletal: Positive for back pain (improving). Negative for falls, joint pain, myalgias and neck pain.  Skin: Negative.   Neurological: Positive for headaches (occasional migraines). Negative for dizziness, tingling, tremors, sensory change, speech change, focal weakness, seizures, loss of consciousness and weakness.  Endo/Heme/Allergies: Negative.   Psychiatric/Behavioral: Negative.     All other ROS negative except what is listed above and in the HPI.      Objective:    BP 121/76   Pulse 80   Ht 5' 3.58" (1.615 m)   Wt 168 lb (76.2 kg)   LMP 01/21/2016 (Exact Date)   SpO2 99%   BMI 29.22 kg/m   Wt Readings from Last 3 Encounters:  11/20/17 168 lb (76.2 kg)  09/23/17 187 lb (84.8 kg)  07/11/16 185 lb 6.4 oz (84.1 kg)      Physical Exam  Constitutional: She is oriented to person, place, and time. She appears well-developed and well-nourished. No distress.  HENT:  Head: Normocephalic and atraumatic.  Right Ear: Hearing, tympanic membrane, external ear and ear canal normal.  Left Ear: Hearing, tympanic membrane, external ear and ear canal normal.  Nose: Nose normal.  Mouth/Throat: Uvula is midline, oropharynx is clear and moist and mucous membranes are normal. No oropharyngeal exudate.  Eyes: Pupils are equal, round, and reactive to light. Conjunctivae, EOM and lids are normal. Right eye exhibits no discharge. Left eye exhibits no discharge. No scleral icterus.  Neck: Normal range of motion. Neck supple. No JVD present. No tracheal deviation present. No thyromegaly present.  Cardiovascular: Normal rate, regular rhythm, normal heart sounds and intact distal pulses. Exam reveals no gallop and no friction rub.  No murmur heard. Pulmonary/Chest: Effort normal and breath sounds normal. No stridor. No respiratory distress. She has no wheezes. She has no rales. She exhibits no tenderness. Right breast exhibits no inverted nipple, no mass, no nipple discharge, no skin change and no tenderness. Left breast exhibits no inverted nipple, no mass, no nipple discharge, no skin change and no tenderness. No breast swelling, tenderness, discharge or bleeding. Breasts are symmetrical.  Abdominal: Soft. Bowel sounds are normal. She exhibits no distension and no mass. There is no tenderness. There is no rebound and no guarding. No hernia.  Genitourinary:  Genitourinary Comments: Pelvic exam deferred with shared decision making  Musculoskeletal: Normal range of motion. She exhibits no edema, tenderness or deformity.  Lymphadenopathy:    She has no cervical adenopathy.  Neurological: She is alert and oriented to person, place, and time. She displays normal reflexes. No cranial nerve deficit or sensory deficit. She exhibits normal muscle  tone. Coordination normal.  Skin: Skin is warm, dry and intact. Capillary refill takes less than 2 seconds. No rash noted. She is not diaphoretic. No erythema. No pallor.  Psychiatric: She has a normal mood and affect. Her speech is normal and behavior is normal. Judgment and thought content normal. Cognition and memory are normal.  Nursing note and vitals reviewed.   Results for orders placed or performed in visit on 07/11/16  Microscopic Examination  Result Value Ref Range   WBC, UA 0-5 0 - 5 /hpf   RBC, UA 0-2 0 - 2 /hpf   Epithelial Cells (non renal) 0-10 0 - 10 /hpf   Bacteria, UA Few None seen/Few  CBC with Differential/Platelet  Result Value Ref Range   WBC 5.6 3.4 - 10.8 x10E3/uL   RBC 4.54 3.77 - 5.28 x10E6/uL   Hemoglobin 12.9 11.1 - 15.9 g/dL   Hematocrit 40.9 81.1 - 46.6 %   MCV 87 79 - 97 fL   MCH 28.4 26.6 - 33.0 pg   MCHC 32.7 31.5 - 35.7 g/dL   RDW 91.4 78.2 - 95.6 %   Platelets 294 150 - 379 x10E3/uL   Neutrophils 66 Not Estab. %   Lymphs 24 Not Estab. %   Monocytes 8 Not Estab. %   Eos 2 Not Estab. %   Basos 0 Not Estab. %   Neutrophils Absolute 3.7 1.4 - 7.0 x10E3/uL   Lymphocytes Absolute 1.3 0.7 - 3.1 x10E3/uL   Monocytes Absolute 0.4 0.1 - 0.9 x10E3/uL   EOS (ABSOLUTE) 0.1 0.0 - 0.4 x10E3/uL   Basophils Absolute 0.0 0.0 - 0.2 x10E3/uL   Immature Granulocytes 0 Not Estab. %   Immature Grans (Abs) 0.0 0.0 - 0.1 x10E3/uL  Comprehensive metabolic panel  Result Value Ref Range   Glucose 87 65 - 99 mg/dL   BUN 10 6 - 24 mg/dL   Creatinine, Ser 2.13 0.57 - 1.00 mg/dL   GFR calc non Af Amer 87 >59 mL/min/1.73   GFR calc Af Amer 100 >59 mL/min/1.73   BUN/Creatinine  Ratio 13 9 - 23   Sodium 143 134 - 144 mmol/L   Potassium 3.9 3.5 - 5.2 mmol/L   Chloride 101 96 - 106 mmol/L   CO2 27 18 - 29 mmol/L   Calcium 10.1 8.7 - 10.2 mg/dL   Total Protein 7.2 6.0 - 8.5 g/dL   Albumin 4.6 3.5 - 5.5 g/dL   Globulin, Total 2.6 1.5 - 4.5 g/dL   Albumin/Globulin Ratio  1.8 1.2 - 2.2   Bilirubin Total 0.4 0.0 - 1.2 mg/dL   Alkaline Phosphatase 57 39 - 117 IU/L   AST 23 0 - 40 IU/L   ALT 23 0 - 32 IU/L  TSH  Result Value Ref Range   TSH 1.920 0.450 - 4.500 uIU/mL  UA/M w/rflx Culture, Routine  Result Value Ref Range   Specific Gravity, UA <1.005 (L) 1.005 - 1.030   pH, UA 6.0 5.0 - 7.5   Color, UA Yellow Yellow   Appearance Ur Clear Clear   Leukocytes, UA Negative Negative   Protein, UA Negative Negative/Trace   Glucose, UA Negative Negative   Ketones, UA Negative Negative   RBC, UA Negative Negative   Bilirubin, UA Negative Negative   Urobilinogen, Ur 0.2 0.2 - 1.0 mg/dL   Nitrite, UA Negative Negative   Microscopic Examination See below:   Lipid panel  Result Value Ref Range   Triglycerides 115 40 - 160 mg/dL   Cholesterol 960210 (A) 0 - 200 mg/dL   HDL 65 35 - 70 mg/dL   LDL Cholesterol 454122 mg/dL  [facility] COLONOSCOPY  Result Value Ref Range   [facility] Colonoscopy Patient Reported See Report (in chart), Patient Reported      Assessment & Plan:   Problem List Items Addressed This Visit      Digestive   GERD (gastroesophageal reflux disease)    Improving. Will check labs. Call with any concerns.       Relevant Orders   CBC with Differential/Platelet     Other   Anemia    Rechecking levels today. Await results.       Relevant Orders   CBC with Differential/Platelet    Other Visit Diagnoses    Routine general medical examination at a health care facility    -  Primary   Vaccines up to date. Screening labs checked today. Pap N/A. Mammo ordered. Colonoscopy up to date. Continue diet and exercise. Call with concerns.    Relevant Orders   CBC with Differential/Platelet   Comprehensive metabolic panel   Lipid Panel w/o Chol/HDL Ratio   TSH   UA/M w/rflx Culture, Routine       Follow up plan: Return in about 1 year (around 11/21/2018) for Physical.   LABORATORY TESTING:  - Pap smear: up to date, not applicable  IMMUNIZATIONS:   -  Tdap: Tetanus vaccination status reviewed: last tetanus booster within 10 years. - Influenza: Refused - Pneumovax: Not applicable  SCREENING: -Mammogram: Order in, encouraged patient to go  - Colonoscopy: Up to date   PATIENT COUNSELING:   Advised to take 1 mg of folate supplement per day if capable of pregnancy.   Sexuality: Discussed sexually transmitted diseases, partner selection, use of condoms, avoidance of unintended pregnancy  and contraceptive alternatives.   Advised to avoid cigarette smoking.  I discussed with the patient that most people either abstain from alcohol or drink within safe limits (<=14/week and <=4 drinks/occasion for males, <=7/weeks and <= 3 drinks/occasion for females) and that the risk for  alcohol disorders and other health effects rises proportionally with the number of drinks per week and how often a drinker exceeds daily limits.  Discussed cessation/primary prevention of drug use and availability of treatment for abuse.   Diet: Encouraged to adjust caloric intake to maintain  or achieve ideal body weight, to reduce intake of dietary saturated fat and total fat, to limit sodium intake by avoiding high sodium foods and not adding table salt, and to maintain adequate dietary potassium and calcium preferably from fresh fruits, vegetables, and low-fat dairy products.    stressed the importance of regular exercise  Injury prevention: Discussed safety belts, safety helmets, smoke detector, smoking near bedding or upholstery.   Dental health: Discussed importance of regular tooth brushing, flossing, and dental visits.    NEXT PREVENTATIVE PHYSICAL DUE IN 1 YEAR. Return in about 1 year (around 11/21/2018) for Physical.

## 2017-11-21 LAB — COMPREHENSIVE METABOLIC PANEL
ALBUMIN: 4.6 g/dL (ref 3.5–5.5)
ALT: 16 IU/L (ref 0–32)
AST: 17 IU/L (ref 0–40)
Albumin/Globulin Ratio: 1.9 (ref 1.2–2.2)
Alkaline Phosphatase: 61 IU/L (ref 39–117)
BILIRUBIN TOTAL: 0.3 mg/dL (ref 0.0–1.2)
BUN/Creatinine Ratio: 15 (ref 9–23)
BUN: 10 mg/dL (ref 6–24)
CALCIUM: 9.8 mg/dL (ref 8.7–10.2)
CHLORIDE: 104 mmol/L (ref 96–106)
CO2: 26 mmol/L (ref 20–29)
CREATININE: 0.67 mg/dL (ref 0.57–1.00)
GFR, EST AFRICAN AMERICAN: 117 mL/min/{1.73_m2} (ref >59)
GFR, EST NON AFRICAN AMERICAN: 101 mL/min/{1.73_m2} (ref >59)
GLUCOSE: 90 mg/dL (ref 65–99)
Globulin, Total: 2.4 g/dL (ref 1.5–4.5)
Potassium: 4.5 mmol/L (ref 3.5–5.2)
Sodium: 144 mmol/L (ref 134–144)
TOTAL PROTEIN: 7 g/dL (ref 6.0–8.5)

## 2017-11-21 LAB — CBC WITH DIFFERENTIAL/PLATELET
BASOS ABS: 0 10*3/uL (ref 0.0–0.2)
Basos: 1 %
EOS (ABSOLUTE): 0.1 10*3/uL (ref 0.0–0.4)
Eos: 1 %
Hematocrit: 39.7 % (ref 34.0–46.6)
Hemoglobin: 13.3 g/dL (ref 11.1–15.9)
IMMATURE GRANS (ABS): 0 10*3/uL (ref 0.0–0.1)
IMMATURE GRANULOCYTES: 0 %
Lymphocytes Absolute: 1.4 10*3/uL (ref 0.7–3.1)
Lymphs: 18 %
MCH: 29.4 pg (ref 26.6–33.0)
MCHC: 33.5 g/dL (ref 31.5–35.7)
MCV: 88 fL (ref 79–97)
MONOS ABS: 0.5 10*3/uL (ref 0.1–0.9)
Monocytes: 6 %
NEUTROS ABS: 5.8 10*3/uL (ref 1.4–7.0)
Neutrophils: 74 %
PLATELETS: 308 10*3/uL (ref 150–450)
RBC: 4.52 x10E6/uL (ref 3.77–5.28)
RDW: 14.5 % (ref 12.3–15.4)
WBC: 7.8 10*3/uL (ref 3.4–10.8)

## 2017-11-21 LAB — LIPID PANEL W/O CHOL/HDL RATIO
Cholesterol, Total: 187 mg/dL (ref 100–199)
HDL: 76 mg/dL (ref >39)
LDL CALC: 95 mg/dL (ref 0–99)
Triglycerides: 81 mg/dL (ref 0–149)
VLDL Cholesterol Cal: 16 mg/dL (ref 5–40)

## 2017-11-21 LAB — TSH: TSH: 1.39 u[IU]/mL (ref 0.450–4.500)

## 2017-11-27 DIAGNOSIS — M545 Low back pain: Secondary | ICD-10-CM | POA: Diagnosis not present

## 2018-01-04 ENCOUNTER — Other Ambulatory Visit: Payer: Self-pay | Admitting: Family Medicine

## 2018-01-04 DIAGNOSIS — Z1231 Encounter for screening mammogram for malignant neoplasm of breast: Secondary | ICD-10-CM

## 2018-02-10 ENCOUNTER — Ambulatory Visit
Admission: RE | Admit: 2018-02-10 | Discharge: 2018-02-10 | Disposition: A | Discharge status: Home / Self Care | Payer: BLUE CROSS/BLUE SHIELD | Source: Ambulatory Visit | Attending: Family Medicine | Admitting: Family Medicine

## 2018-02-10 DIAGNOSIS — Z1231 Encounter for screening mammogram for malignant neoplasm of breast: Secondary | ICD-10-CM

## 2018-02-19 DIAGNOSIS — J019 Acute sinusitis, unspecified: Secondary | ICD-10-CM | POA: Diagnosis not present

## 2018-11-26 ENCOUNTER — Encounter: Payer: BLUE CROSS/BLUE SHIELD | Admitting: Family Medicine

## 2019-01-24 ENCOUNTER — Ambulatory Visit: Payer: Self-pay | Admitting: *Deleted

## 2019-01-24 NOTE — Telephone Encounter (Signed)
Chapped, dry raised small bumps around mouth, chin and neck where she wears a mask. Itchy. No fever. No other symptoms. Care advice including moisturizer twice daily, hydrocortisone OTC for the itching, but use sparingly as it will dry out skin evern more. OTC antibiotic cream if needed sparingly for several days. Leave open to air when possible. Monitor for signs of infection. Call back with any questions/concerns.   Answer Assessment - Initial Assessment Questions 1.) CALLER DIAGNOSIS: "What do you think is causing the rash?" (e.g., Chickenpox, Hives, Impetigo, Athlete's Foot, etc.)  2.) LOCATION:  "Is it widespread or localized?"  3.) NEW MEDICATIONS: "Are you taking any new medicine?" Itchy rash-like dry areas round her mouth chin cheeks and neck.  Protocols used: RASH - GUIDELINE [place]

## 2019-03-11 ENCOUNTER — Encounter: Payer: BC Managed Care – PPO | Admitting: Family Medicine

## 2020-06-04 IMAGING — DX DG LUMBAR SPINE COMPLETE 4+V
6 series · 6 of 6 positions shown · non-contrast
Comparison: None.

CLINICAL DATA: Low back pain with sciatica intermittent for 6
years, recent flare for 6 weeks.

EXAM:
LUMBAR SPINE - COMPLETE 4+ VIEW

[l-spine ap]
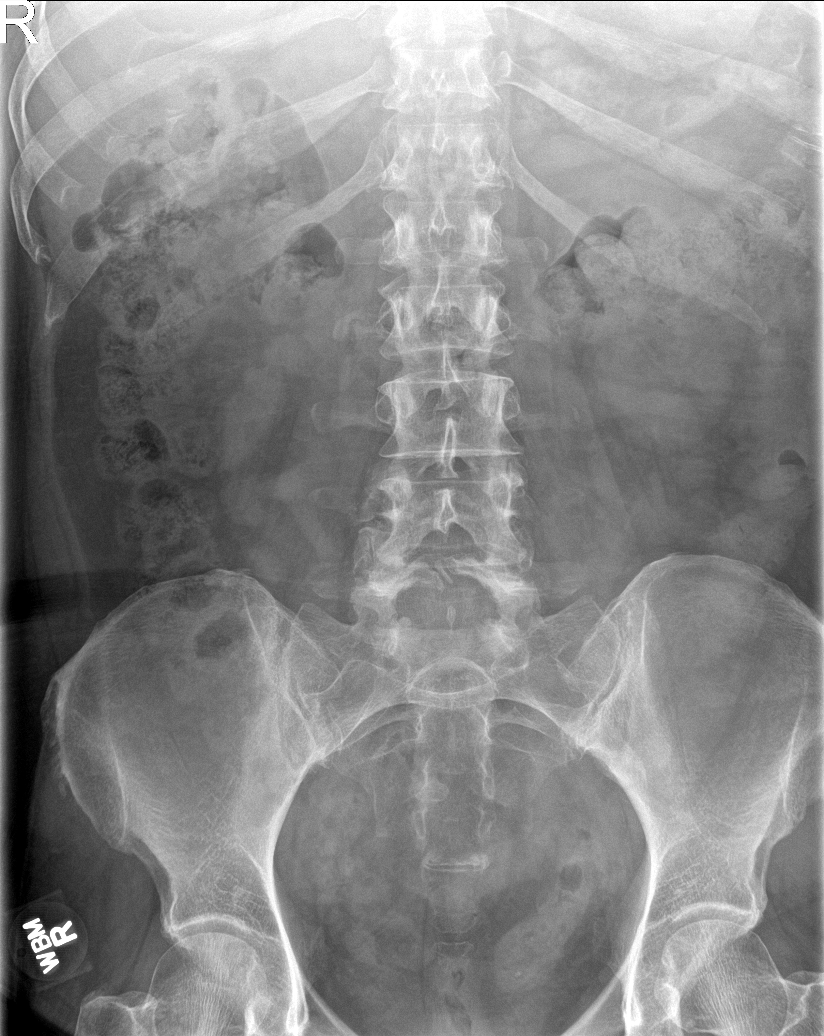

[l-spine obl (1 of 3)]
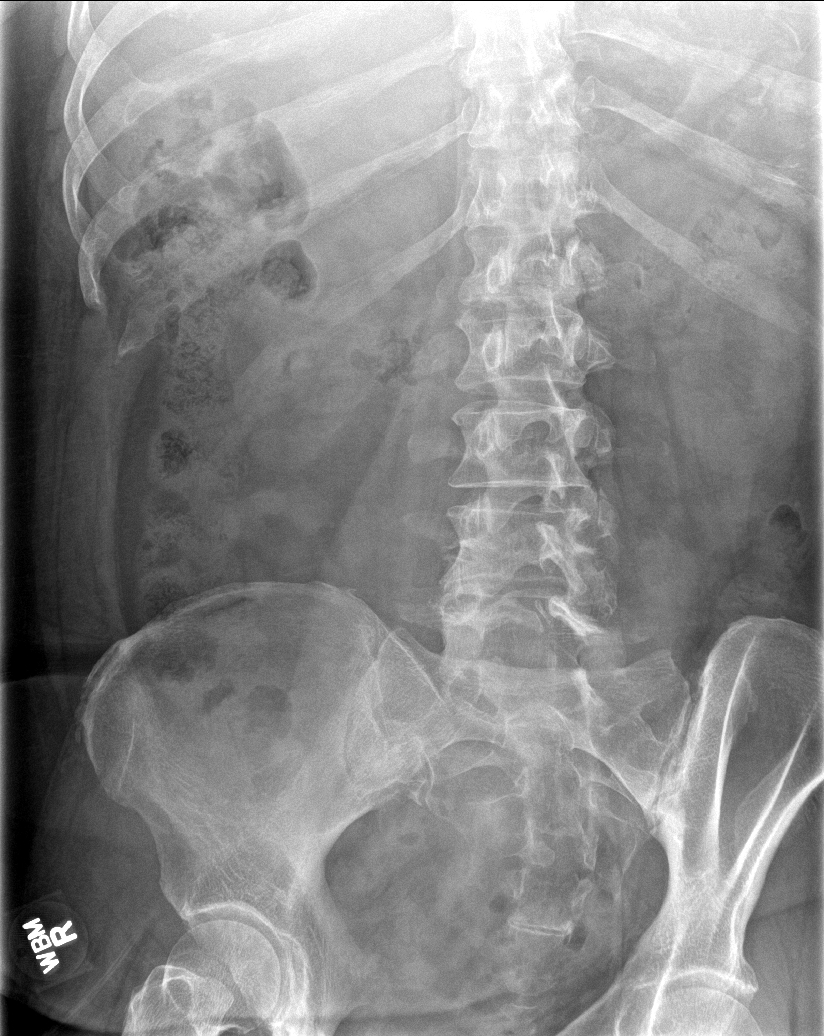

[l-spine obl (2 of 3)]
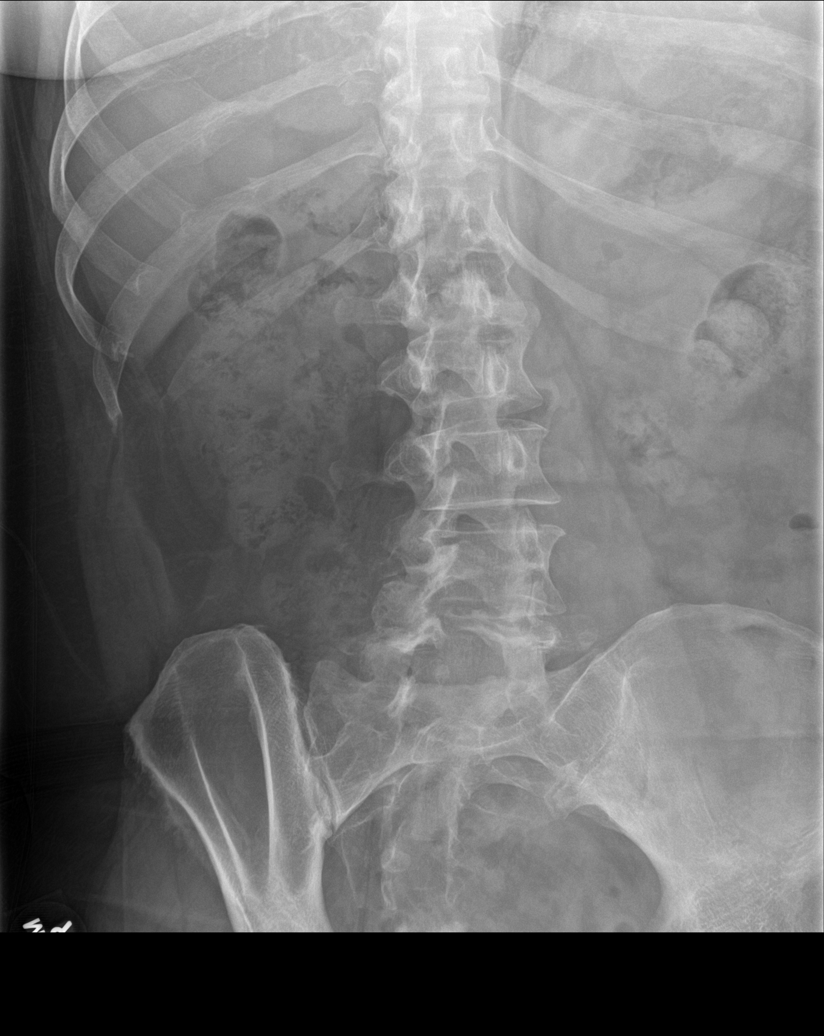

[l-spine lat]
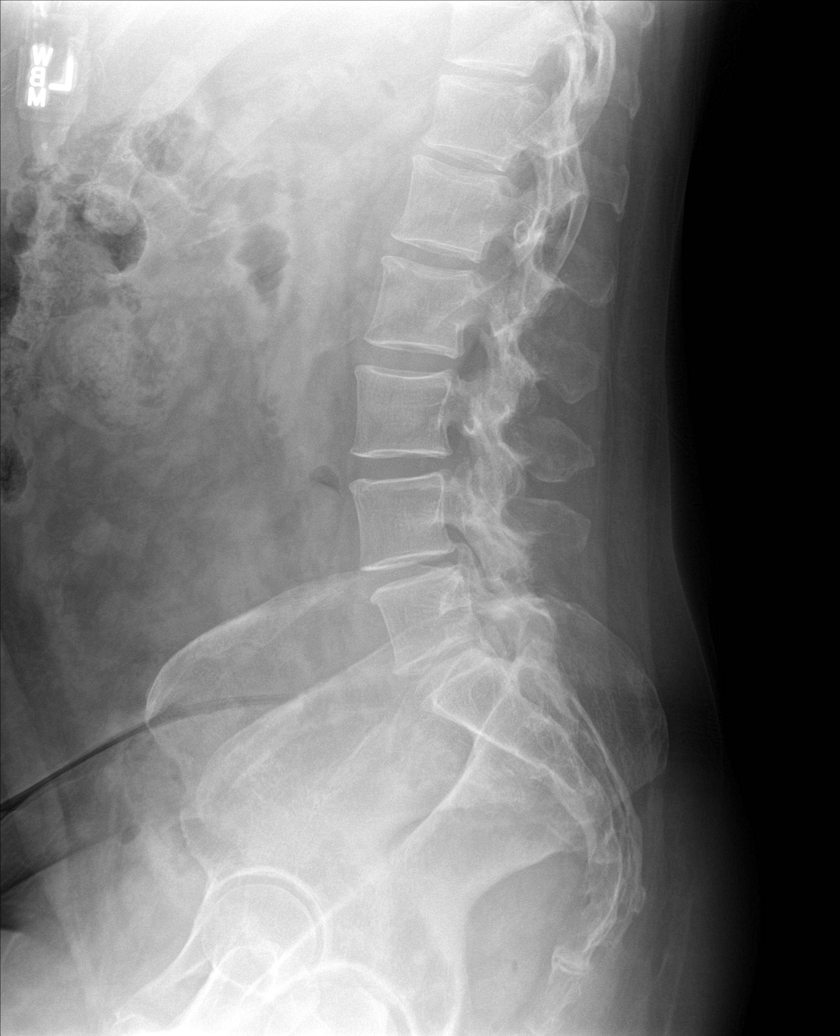

[l-spine spot]
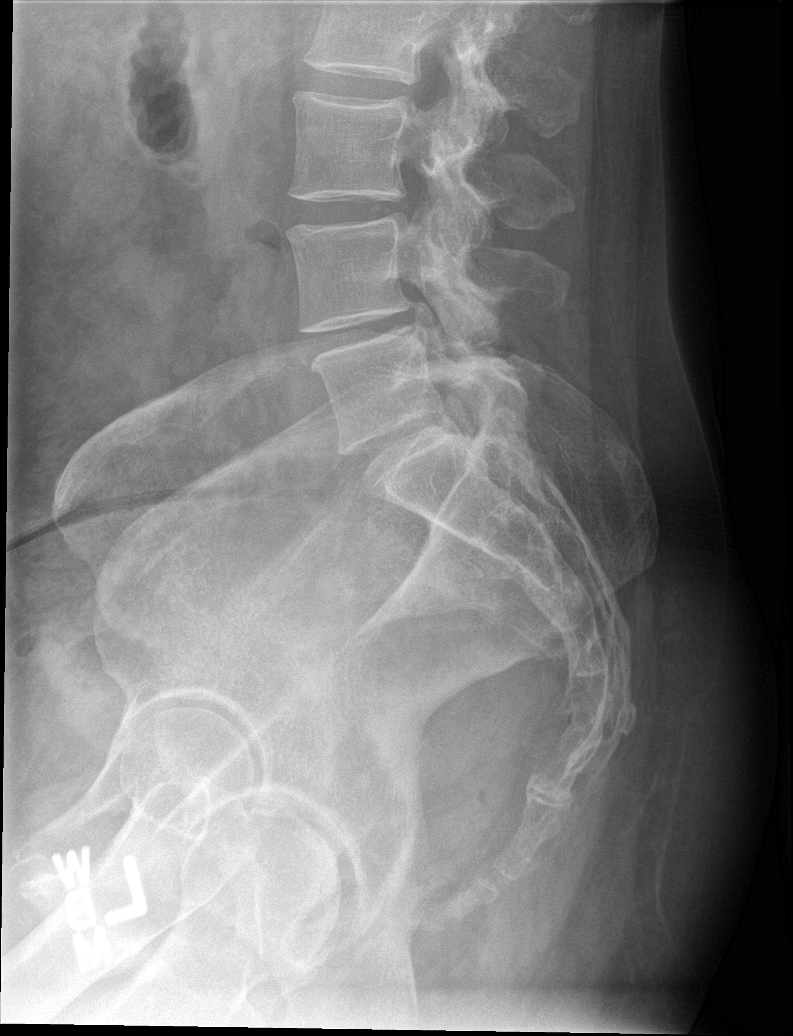

[l-spine obl (3 of 3)]
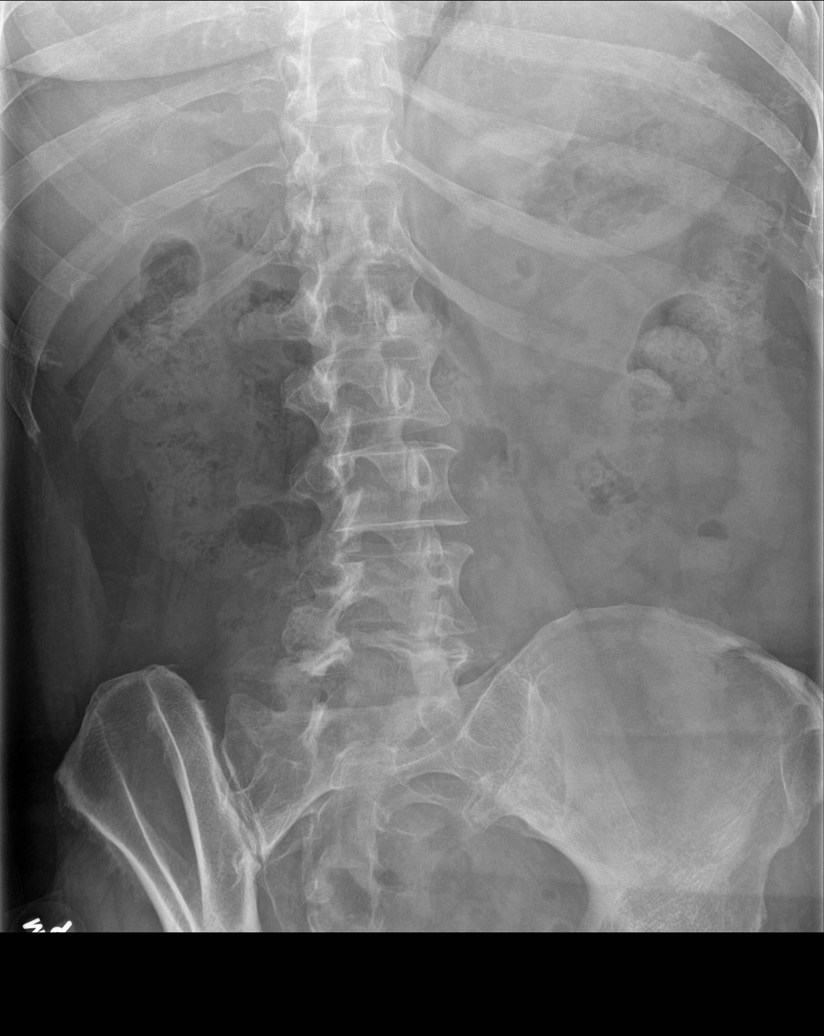

[6 of 6 positions shown; findings below may reference images not displayed]

FINDINGS: Osseous alignment is normal. Bone mineralization appears normal. No
acute or suspicious osseous lesion.

Disc spaces appear well maintained, however, there is at least mild
degenerative facet hypertrophy at the L4-5 and L5-S1 levels.
Visualized paravertebral soft tissues are unremarkable.
IMPRESSION: 1. No acute findings.
2. Degenerative facet hypertrophy at the L4-5 and L5-S1 levels, mild
to moderate in degree. Given the history of low back pain with
RIGHT-sided sciatica, would consider nonemergent lumbar spine MRI to
exclude associated nerve root impingement.

## 2021-06-25 ENCOUNTER — Encounter: Payer: Self-pay | Admitting: Family Medicine
